# Patient Record
Sex: Female | Born: 1970
Health system: Southern US, Community
[De-identification: ages and names within clinical notes are randomized; demographics above are authoritative.]

## PROBLEM LIST (undated history)

## (undated) DIAGNOSIS — H9190 Unspecified hearing loss, unspecified ear: Secondary | ICD-10-CM

## (undated) DIAGNOSIS — Z9889 Other specified postprocedural states: Secondary | ICD-10-CM

## (undated) DIAGNOSIS — Z923 Personal history of irradiation: Secondary | ICD-10-CM

## (undated) DIAGNOSIS — C50411 Malignant neoplasm of upper-outer quadrant of right female breast: Principal | ICD-10-CM

## (undated) DIAGNOSIS — C50919 Malignant neoplasm of unspecified site of unspecified female breast: Secondary | ICD-10-CM

## (undated) DIAGNOSIS — R0683 Snoring: Secondary | ICD-10-CM

## (undated) DIAGNOSIS — K219 Gastro-esophageal reflux disease without esophagitis: Secondary | ICD-10-CM

## (undated) HISTORY — PX: CHOLECYSTECTOMY: SHX55

## (undated) HISTORY — DX: Malignant neoplasm of unspecified site of unspecified female breast: C50.919

## (undated) HISTORY — DX: Other specified postprocedural states: Z98.890

## (undated) HISTORY — PX: WISDOM TOOTH EXTRACTION: SHX21

## (undated) HISTORY — PX: ESSURE TUBAL LIGATION: SUR464

## (undated) HISTORY — DX: Malignant neoplasm of upper-outer quadrant of right female breast: C50.411

## (undated) HISTORY — DX: Unspecified hearing loss, unspecified ear: H91.90

---

## 2000-06-19 ENCOUNTER — Other Ambulatory Visit: Admission: RE | Admit: 2000-06-19 | Discharge: 2000-06-19 | Payer: Self-pay | Admitting: Obstetrics and Gynecology

## 2001-06-25 ENCOUNTER — Other Ambulatory Visit: Admission: RE | Admit: 2001-06-25 | Discharge: 2001-06-25 | Payer: Self-pay | Admitting: Obstetrics and Gynecology

## 2002-06-24 ENCOUNTER — Other Ambulatory Visit: Admission: RE | Admit: 2002-06-24 | Discharge: 2002-06-24 | Payer: Self-pay | Admitting: Obstetrics and Gynecology

## 2003-07-03 ENCOUNTER — Other Ambulatory Visit: Admission: RE | Admit: 2003-07-03 | Discharge: 2003-07-03 | Payer: Self-pay | Admitting: Obstetrics and Gynecology

## 2004-01-04 ENCOUNTER — Observation Stay (HOSPITAL_COMMUNITY): Admission: RE | Admit: 2004-01-04 | Discharge: 2004-01-05 | Payer: Self-pay | Admitting: General Surgery

## 2004-01-04 ENCOUNTER — Encounter (INDEPENDENT_AMBULATORY_CARE_PROVIDER_SITE_OTHER): Payer: Self-pay | Admitting: Specialist

## 2004-07-12 ENCOUNTER — Other Ambulatory Visit: Admission: RE | Admit: 2004-07-12 | Discharge: 2004-07-12 | Payer: Self-pay | Admitting: Obstetrics and Gynecology

## 2006-02-13 ENCOUNTER — Other Ambulatory Visit: Admission: RE | Admit: 2006-02-13 | Discharge: 2006-02-13 | Payer: Self-pay | Admitting: Obstetrics and Gynecology

## 2006-08-13 ENCOUNTER — Inpatient Hospital Stay (HOSPITAL_COMMUNITY): Admission: AD | Admit: 2006-08-13 | Discharge: 2006-08-13 | Payer: Self-pay | Admitting: Obstetrics and Gynecology

## 2006-08-17 ENCOUNTER — Inpatient Hospital Stay (HOSPITAL_COMMUNITY): Admission: AD | Admit: 2006-08-17 | Discharge: 2006-08-19 | Payer: Self-pay | Admitting: Obstetrics and Gynecology

## 2007-01-22 ENCOUNTER — Ambulatory Visit (HOSPITAL_COMMUNITY): Admission: RE | Admit: 2007-01-22 | Discharge: 2007-01-22 | Payer: Self-pay | Admitting: Obstetrics and Gynecology

## 2007-04-30 ENCOUNTER — Encounter: Admission: RE | Admit: 2007-04-30 | Discharge: 2007-04-30 | Payer: Self-pay | Admitting: Obstetrics and Gynecology

## 2008-05-01 ENCOUNTER — Encounter: Admission: RE | Admit: 2008-05-01 | Discharge: 2008-05-01 | Payer: Self-pay | Admitting: Obstetrics and Gynecology

## 2009-05-04 ENCOUNTER — Encounter: Admission: RE | Admit: 2009-05-04 | Discharge: 2009-05-04 | Payer: Self-pay | Admitting: Obstetrics and Gynecology

## 2010-05-31 ENCOUNTER — Encounter: Admission: RE | Admit: 2010-05-31 | Discharge: 2010-05-31 | Payer: Self-pay | Admitting: Obstetrics and Gynecology

## 2010-12-20 NOTE — Discharge Summary (Signed)
Dorothy Gallagher, Dorothy Gallagher                ACCOUNT NO.:  [id number]   MEDICAL RECORD NO.:  [id number]          PATIENT TYPE:  INP   LOCATION:  9110                          FACILITY:  [facility]   PHYSICIAN:  Zenaida Niece, M.D.DATE OF BIRTH:  06/24/1971   DATE OF ADMISSION:  08/17/2006  DATE OF DISCHARGE:  08/19/2006                               DISCHARGE SUMMARY   ADMISSION DIAGNOSES:  1. Intrauterine pregnancy at 38 weeks.  2. Pregnancy-induced hypertension.   DISCHARGE DIAGNOSES:  1. Intrauterine pregnancy at 38 weeks.  2. Pregnancy-induced hypertension.   PROCEDURE:  On August 17, 2006, she had a spontaneous vaginal delivery.   HISTORY AND PHYSICAL:  This is a 40 year old white female gravida 3,  para 2-0-0-2 with an EGA of [redacted] weeks who presents with a complaint of  ruptured membranes at 0415 on the day of admission with occasional  contractions.  Evaluation in triage revealed rupture of membranes and  cervix was 2-3 cm dilated.  Prenatal care complicated by advanced  maternal age with normal ultrasound.  She has had gastroesophageal  reflux treated with Protonix.  She has had intermittently elevated blood  pressures with normal labs.   PRENATAL LABS:  Blood type is A+ with a negative antibody screen, RPR  nonreactive, rubella immune, hepatitis B surface antigen negative,  gonorrhea and chlamydia negative, 1-hour Glucola 160, 3-hour GTT is 72,  160, 143 and 135 and group B strep is negative.   PAST OBSTETRICAL HISTORY:  Two vaginal deliveries at term, 8 pounds 3  ounces and 8 pounds with PIH with the second pregnancy.   GYNECOLOGICAL HISTORY:  Remote history of cryotherapy.   PAST SURGICAL HISTORY:  Cholecystectomy.   ALLERGIES:  None.   MEDICATIONS:  Protonix.   PHYSICAL EXAMINATION:  VITAL SIGNS:  She is afebrile with stable vital  signs except for blood pressures 150s/100s.  Fetal heart tracing is  reactive with contractions every 3-4 minutes on Pitocin.  ABDOMEN:   Gravid, nontender with an estimated fetal weight of 8 pounds.  CERVIX:  On admission is 2-3 cm dilated, on labor and delivery, the  nurse says she is 3, 70, -2 and vertex.   HOSPITAL COURSE:  The patient was admitted and put on Pitocin for labor  augmentation.  PIH labs were drawn and were normal.  Blood pressure  elevated to the 150s-160s/100s-110s and she was treated twice with 20 mg  of IV labetalol.  She progressed in labor and received an epidural.  She  progressed to complete, pushed well and on the evening of August 17, 2006, had a vaginal delivery of a viable female infant with Apgars of 7  and 9 that weighed 7 pounds 1 ounce and a loose nuchal cord x1 was  reduced.  Placenta delivered spontaneous, was intact and sent for cord  blood collection.  Perineum was intact and estimated blood loss was less  than 500 mL.   Postpartum, she had intermittently elevated blood pressures with repeat  PIH labs normal.  Predelivery hemoglobin 11.3, postdelivery 11.1.   On postpartum #2, she was felt to be  stable enough for discharge home.   DISCHARGE INSTRUCTIONS:  Regular diet, pelvic rest, follow-up is in 1  week to check her blood pressure.   MEDICATIONS:  Over-the-counter ibuprofen as needed.   She is given our discharge pamphlet.      Zenaida Niece, M.D.  Electronically Signed     TDM/MEDQ  D:  08/19/2006  T:  08/19/2006  Job:  [id number]

## 2010-12-20 NOTE — Op Note (Signed)
NAME:  Dorothy Gallagher, Dorothy Gallagher                          ACCOUNT NO.:  [id number]   MEDICAL RECORD NO.:  [id number]                   PATIENT TYPE:  AMB   LOCATION:  DAY                                  FACILITY:  [facility]   PHYSICIAN:  Ollen Gross. Vernell Morgans, M.D.              DATE OF BIRTH:  05-Oct-1970   DATE OF PROCEDURE:  01/04/2004  DATE OF DISCHARGE:                                 OPERATIVE REPORT   PREOPERATIVE DIAGNOSIS:  Gallstones.   POSTOPERATIVE DIAGNOSIS:  Gallstones.   OPERATION/PROCEDURE:  Laparoscopic cholecystectomy with intraoperative  cholangiogram.   SURGEON:  Ollen Gross. Carolynne Edouard, M.D.   ASSISTANT:  Abigail Miyamoto, M.D.   ANESTHESIA:  General endotracheal anesthesia.   DESCRIPTION OF PROCEDURE:  After informed consent was obtained, the patient  was brought to the operating room and placed in the supine position on the  operating room table.  After adequate induction of general anesthesia, the  patient's abdomen was prepped with Betadine and draped in the usual sterile  manner.  The area below the umbilicus was infiltrated with 0.25% Marcaine  with epinephrine.  A small incision was made with the 15-blade knife.  This  incision was carried down through the subcutaneous tissue bluntly with the  Kelly clamp and [employer] retractors until the linea alba was identified.  The linea alba was incised with the 15-blade knife and each side was grasped  with Kocher clamps and elevated anteriorly.  The preperitoneal space was  then probed bluntly with a hemostat until the peritoneum was opened and  access was gained to the abdominal cavity.  A 0 Vicryl pursestring stitch  was placed in the fascia surrounding the opening. The Hasson cannula was  placed through the opening and anchored in place with the previously placed  Vicryl pursestring stitch.  The abdomen was then insufflated with carbon  dioxide without difficulty.  The patient was placed in the head-up position  and rotated slightly  with the right side up.  The laparoscope was placed  through the Hasson cannula and the right upper quadrant was inspected.  The  dome of the gallbladder and liver were readily identified.  The epigastric  region was then infiltrated with 0.25% Marcaine with epinephrine.  A small  incision was made with the 15-blade knife and a 10 mm port was placed  bluntly through this incision into the abdominal cavity under direct vision.  Sites were then chosen laterally on the right side of the abdomen with  placement of a 5 mm ports.  Each of these areas was infiltrated with 0.25%  Marcaine.  Small stab incisions were made with 15-blade knife and 5 mm ports  were placed bluntly through these incisions into the abdominal cavity under  direct vision.  A blunt grasper was placed through the lateral-most 5 mm  port and used to grasp the dome of the gallbladder and elevate it anteriorly  and  superiorly.  Another blunt grasper was placed in the other 5 mm port and  used to retract on the body and neck of the gallbladder.  Some adhesions to  the gallbladder body were taken down sharply with the laparoscopic scissors.  Next, a laparoscopic dissector was placed through the epigastric port and  using the electrocautery, the peritoneal reflection at the gallbladder neck  area was opened.  Blunt dissection was then carried out in this area until  the gallbladder neck-cystic duct junction was readily identified and a good  window was created.  A single clip was placed proximally on the gallbladder  neck.  A small ductotomy was made just below the clip.  A 14-gauge angiocath  was placed percutaneously through the anterior abdominal wall under direct  vision.  A Reddick cholangiogram catheter was placed through the angiocath  and flushed.  The Reddick catheter was then placed within the cystic duct  and anchored in place with a clip.  A cholangiogram was obtained that showed  no filling defects, good emptying into  the duodenum and along the length on  the cystic duct.  The anchoring clip and catheters were then removed from  the patient.  Two clips were placed proximally on the cystic duct and the  duct was divided between the two sets of clips.  Posterior to this the  cystic artery was identified and dissected bluntly in a circumferential  manner until a good window was created.  Two clips were placed proximally  and one distally on the artery and the artery was divided between the two.  Next, the laparoscopic hook cautery device was used to separate the  gallbladder from the liver bed.  Prior to completely detaching the  gallbladder from the liver bed, the liver bed was inspected and several  small bleeding points were coagulated with the electrocautery.  The  gallbladder was then attached the rest of the way from the liver bed without  difficulty with the hook cautery.  A laparoscopic bag was then placed  through the epigastric port and opened.  The gallbladder was placed within  the bag and the bag was sealed.  The abdomen was then irrigated with copious  amounts of saline until the effluent was clear.  The liver bed was inspected  and again was found to be hemostatic.  The  laparoscopic was then moved to  the epigastric port and a gallbladder grasper was placed through the Hasson  cannula and used to grasp the open end of the bag.  The bag with the  gallbladder was then removed with the Hasson cannula through the  infraumbilical port without difficulty.  The fascial defect was closed with  the previously placed Vicryl pursestring stitch as well as with another  figure-of-eight 0 Vicryl stitch.  The rest of the ports were removed under  direct vision and were found to be hemostatic.  The gas was allowed to  escape.  The skin incisions were all closed with interrupted 4-0 Monocryl  subcuticular stitches.  Benzoin and Steri-Strips were applied.  The patient tolerated the procedure well.  At the end  of the case all needle, sponge and  instrument counts were correct.  The patient was awakened and taken to the  recovery room in stable condition.  Ollen Gross. Vernell Morgans, M.D.    PST/MEDQ  D:  01/04/2004  T:  01/04/2004  Job:  [id number]

## 2011-10-29 ENCOUNTER — Other Ambulatory Visit: Payer: Self-pay | Admitting: Otolaryngology

## 2011-10-29 DIAGNOSIS — H9121 Sudden idiopathic hearing loss, right ear: Secondary | ICD-10-CM

## 2011-10-31 ENCOUNTER — Ambulatory Visit
Admission: RE | Admit: 2011-10-31 | Discharge: 2011-10-31 | Disposition: A | Discharge status: Home / Self Care | Payer: Managed Care, Other (non HMO) | Source: Ambulatory Visit | Attending: Otolaryngology | Admitting: Otolaryngology

## 2011-10-31 DIAGNOSIS — H9121 Sudden idiopathic hearing loss, right ear: Secondary | ICD-10-CM

## 2011-10-31 MED ORDER — GADOBENATE DIMEGLUMINE 529 MG/ML IV SOLN
19.0000 mL | Freq: Once | INTRAVENOUS | Status: AC | PRN
  Administered 2011-10-31: 19 mL via INTRAVENOUS

## 2011-12-17 ENCOUNTER — Other Ambulatory Visit: Payer: Self-pay | Admitting: Neurology

## 2011-12-17 DIAGNOSIS — R42 Dizziness and giddiness: Secondary | ICD-10-CM

## 2011-12-17 DIAGNOSIS — H9191 Unspecified hearing loss, right ear: Secondary | ICD-10-CM

## 2011-12-27 ENCOUNTER — Ambulatory Visit
Admission: RE | Admit: 2011-12-27 | Discharge: 2011-12-27 | Disposition: A | Discharge status: Home / Self Care | Payer: Managed Care, Other (non HMO) | Source: Ambulatory Visit | Attending: Neurology | Admitting: Neurology

## 2011-12-27 DIAGNOSIS — H9191 Unspecified hearing loss, right ear: Secondary | ICD-10-CM

## 2011-12-27 DIAGNOSIS — R42 Dizziness and giddiness: Secondary | ICD-10-CM

## 2011-12-27 MED ORDER — GADOBENATE DIMEGLUMINE 529 MG/ML IV SOLN
19.0000 mL | Freq: Once | INTRAVENOUS | Status: AC | PRN
  Administered 2011-12-27: 19 mL via INTRAVENOUS

## 2012-04-30 ENCOUNTER — Other Ambulatory Visit: Payer: Self-pay | Admitting: Obstetrics and Gynecology

## 2012-04-30 DIAGNOSIS — Z1231 Encounter for screening mammogram for malignant neoplasm of breast: Secondary | ICD-10-CM

## 2012-05-31 ENCOUNTER — Ambulatory Visit: Payer: Managed Care, Other (non HMO)

## 2012-06-07 ENCOUNTER — Ambulatory Visit
Admission: RE | Admit: 2012-06-07 | Discharge: 2012-06-07 | Disposition: A | Discharge status: Home / Self Care | Payer: Managed Care, Other (non HMO) | Source: Ambulatory Visit | Attending: Obstetrics and Gynecology | Admitting: Obstetrics and Gynecology

## 2012-06-07 DIAGNOSIS — Z1231 Encounter for screening mammogram for malignant neoplasm of breast: Secondary | ICD-10-CM

## 2012-06-10 ENCOUNTER — Other Ambulatory Visit: Payer: Self-pay | Admitting: Obstetrics and Gynecology

## 2012-06-10 DIAGNOSIS — R928 Other abnormal and inconclusive findings on diagnostic imaging of breast: Secondary | ICD-10-CM

## 2012-06-11 ENCOUNTER — Ambulatory Visit
Admission: RE | Admit: 2012-06-11 | Discharge: 2012-06-11 | Disposition: A | Discharge status: Home / Self Care | Payer: Managed Care, Other (non HMO) | Source: Ambulatory Visit | Attending: Obstetrics and Gynecology | Admitting: Obstetrics and Gynecology

## 2012-06-11 DIAGNOSIS — R928 Other abnormal and inconclusive findings on diagnostic imaging of breast: Secondary | ICD-10-CM

## 2013-05-16 ENCOUNTER — Other Ambulatory Visit: Payer: Self-pay

## 2013-05-16 DIAGNOSIS — Z1231 Encounter for screening mammogram for malignant neoplasm of breast: Secondary | ICD-10-CM

## 2013-06-17 ENCOUNTER — Ambulatory Visit
Admission: RE | Admit: 2013-06-17 | Discharge: 2013-06-17 | Disposition: A | Discharge status: Home / Self Care | Payer: Managed Care, Other (non HMO) | Source: Ambulatory Visit

## 2013-06-17 DIAGNOSIS — Z1231 Encounter for screening mammogram for malignant neoplasm of breast: Secondary | ICD-10-CM

## 2014-05-26 ENCOUNTER — Other Ambulatory Visit: Payer: Self-pay

## 2014-05-26 DIAGNOSIS — Z1231 Encounter for screening mammogram for malignant neoplasm of breast: Secondary | ICD-10-CM

## 2014-06-19 ENCOUNTER — Ambulatory Visit: Payer: Managed Care, Other (non HMO)

## 2014-06-19 ENCOUNTER — Ambulatory Visit
Admission: RE | Admit: 2014-06-19 | Discharge: 2014-06-19 | Disposition: A | Discharge status: Home / Self Care | Payer: Managed Care, Other (non HMO) | Source: Ambulatory Visit

## 2014-06-19 DIAGNOSIS — Z1231 Encounter for screening mammogram for malignant neoplasm of breast: Secondary | ICD-10-CM

## 2014-08-04 DIAGNOSIS — Z9889 Other specified postprocedural states: Secondary | ICD-10-CM

## 2014-08-04 HISTORY — DX: Other specified postprocedural states: Z98.890

## 2015-09-27 ENCOUNTER — Other Ambulatory Visit: Payer: Self-pay | Admitting: Obstetrics and Gynecology

## 2015-09-27 DIAGNOSIS — R928 Other abnormal and inconclusive findings on diagnostic imaging of breast: Secondary | ICD-10-CM

## 2015-10-02 ENCOUNTER — Other Ambulatory Visit: Payer: Self-pay | Admitting: Obstetrics and Gynecology

## 2015-10-02 ENCOUNTER — Ambulatory Visit
Admission: RE | Admit: 2015-10-02 | Discharge: 2015-10-02 | Disposition: A | Discharge status: Home / Self Care | Payer: Managed Care, Other (non HMO) | Source: Ambulatory Visit | Attending: Obstetrics and Gynecology | Admitting: Obstetrics and Gynecology

## 2015-10-02 DIAGNOSIS — R928 Other abnormal and inconclusive findings on diagnostic imaging of breast: Secondary | ICD-10-CM

## 2015-10-04 ENCOUNTER — Ambulatory Visit
Admission: RE | Admit: 2015-10-04 | Discharge: 2015-10-04 | Disposition: A | Discharge status: Home / Self Care | Payer: Managed Care, Other (non HMO) | Source: Ambulatory Visit | Attending: Obstetrics and Gynecology | Admitting: Obstetrics and Gynecology

## 2015-10-04 ENCOUNTER — Other Ambulatory Visit: Payer: Self-pay | Admitting: Obstetrics and Gynecology

## 2015-10-04 DIAGNOSIS — R928 Other abnormal and inconclusive findings on diagnostic imaging of breast: Secondary | ICD-10-CM

## 2015-10-04 DIAGNOSIS — C50411 Malignant neoplasm of upper-outer quadrant of right female breast: Secondary | ICD-10-CM

## 2015-10-04 HISTORY — PX: BREAST BIOPSY: SHX20

## 2015-10-04 HISTORY — DX: Malignant neoplasm of upper-outer quadrant of right female breast: C50.411

## 2015-10-05 ENCOUNTER — Encounter: Payer: Self-pay | Admitting: *Deleted

## 2015-10-05 ENCOUNTER — Telehealth: Payer: Self-pay | Admitting: *Deleted

## 2015-10-05 DIAGNOSIS — C50411 Malignant neoplasm of upper-outer quadrant of right female breast: Secondary | ICD-10-CM

## 2015-10-05 NOTE — Telephone Encounter (Signed)
Confirmed [facility] for 10/10/15 at 1215.  Instructions and contact information given.

## 2015-10-08 ENCOUNTER — Telehealth: Payer: Self-pay | Admitting: *Deleted

## 2015-10-08 NOTE — Telephone Encounter (Signed)
Mailed clinic packet to pt.  

## 2015-10-09 ENCOUNTER — Inpatient Hospital Stay: Admission: RE | Admit: 2015-10-09 | Payer: Managed Care, Other (non HMO) | Source: Ambulatory Visit

## 2015-10-10 ENCOUNTER — Other Ambulatory Visit: Payer: Self-pay | Admitting: General Surgery

## 2015-10-10 ENCOUNTER — Ambulatory Visit: Payer: Managed Care, Other (non HMO) | Attending: General Surgery | Admitting: Physical Therapy

## 2015-10-10 ENCOUNTER — Encounter: Payer: Self-pay | Admitting: Nurse Practitioner

## 2015-10-10 ENCOUNTER — Encounter: Payer: Self-pay | Admitting: Physical Therapy

## 2015-10-10 ENCOUNTER — Other Ambulatory Visit (HOSPITAL_BASED_OUTPATIENT_CLINIC_OR_DEPARTMENT_OTHER): Payer: Managed Care, Other (non HMO)

## 2015-10-10 ENCOUNTER — Ambulatory Visit (HOSPITAL_BASED_OUTPATIENT_CLINIC_OR_DEPARTMENT_OTHER): Payer: Managed Care, Other (non HMO) | Admitting: Hematology and Oncology

## 2015-10-10 ENCOUNTER — Encounter: Payer: Self-pay | Admitting: Genetic Counselor

## 2015-10-10 ENCOUNTER — Ambulatory Visit
Admission: RE | Admit: 2015-10-10 | Discharge: 2015-10-10 | Disposition: A | Discharge status: Home / Self Care | Payer: Managed Care, Other (non HMO) | Source: Ambulatory Visit | Attending: Radiation Oncology | Admitting: Radiation Oncology

## 2015-10-10 VITALS — BP 143/77 | HR 70 | Temp 98.0°F | Resp 18 | Ht 64.0 in | Wt 201.8 lb

## 2015-10-10 DIAGNOSIS — C50411 Malignant neoplasm of upper-outer quadrant of right female breast: Secondary | ICD-10-CM

## 2015-10-10 DIAGNOSIS — R293 Abnormal posture: Secondary | ICD-10-CM | POA: Diagnosis present

## 2015-10-10 DIAGNOSIS — Z170 Estrogen receptor positive status [ER+]: Secondary | ICD-10-CM

## 2015-10-10 LAB — CBC WITH DIFFERENTIAL/PLATELET
BASO%: 0.5 % (ref 0.0–2.0)
Basophils Absolute: 0 10*3/uL (ref 0.0–0.1)
EOS%: 2.7 % (ref 0.0–7.0)
Eosinophils Absolute: 0.2 10*3/uL (ref 0.0–0.5)
HCT: 37.2 % (ref 34.8–46.6)
HEMOGLOBIN: 12.6 g/dL (ref 11.6–15.9)
LYMPH%: 30.5 % (ref 14.0–49.7)
MCH: 29 pg (ref 25.1–34.0)
MCHC: 33.9 g/dL (ref 31.5–36.0)
MCV: 85.7 fL (ref 79.5–101.0)
MONO#: 0.6 10*3/uL (ref 0.1–0.9)
MONO%: 8.6 % (ref 0.0–14.0)
NEUT%: 57.7 % (ref 38.4–76.8)
NEUTROS ABS: 4.2 10*3/uL (ref 1.5–6.5)
Platelets: 304 10*3/uL (ref 145–400)
RBC: 4.34 10*6/uL (ref 3.70–5.45)
RDW: 13.1 % (ref 11.2–14.5)
WBC: 7.3 10*3/uL (ref 3.9–10.3)
lymph#: 2.2 10*3/uL (ref 0.9–3.3)

## 2015-10-10 LAB — COMPREHENSIVE METABOLIC PANEL
ALBUMIN: 3.9 g/dL (ref 3.5–5.0)
ALK PHOS: 68 U/L (ref 40–150)
ALT: 13 U/L (ref 0–55)
AST: 14 U/L (ref 5–34)
Anion Gap: 10 mEq/L (ref 3–11)
BUN: 12.4 mg/dL (ref 7.0–26.0)
CO2: 25 meq/L (ref 22–29)
Calcium: 9.6 mg/dL (ref 8.4–10.4)
Chloride: 106 mEq/L (ref 98–109)
Creatinine: 0.8 mg/dL (ref 0.6–1.1)
EGFR: 87 mL/min/{1.73_m2} — ABNORMAL LOW (ref >90)
GLUCOSE: 97 mg/dL (ref 70–140)
POTASSIUM: 4.2 meq/L (ref 3.5–5.1)
SODIUM: 141 meq/L (ref 136–145)
TOTAL PROTEIN: 7.4 g/dL (ref 6.4–8.3)
Total Bilirubin: <0.3 mg/dL (ref 0.20–1.20)

## 2015-10-10 NOTE — Progress Notes (Signed)
Radiation Oncology         [phone] ________________________________  Name: Dorothy Gallagher MRN: [id number]  Date: 10/10/2015  DOB: 26-Apr-1971  RA:[id number] D, MD  Rolm Bookbinder, MD     REFERRING PHYSICIAN: Rolm Bookbinder, MD   DIAGNOSIS: The encounter diagnosis was Breast cancer of upper-outer quadrant of right female breast [facility]).   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   HISTORY OF PRESENT ILLNESS::Eleny C Koffman is a 45 y.o. female who is seen for an initial consultation visit. She had a mammogram of the right breast that showed calcifications which led to diagnostic imaging. She had an ultrasound of the breast 10/02/2015, revealing a hypoechoic mass in the 10 o'clock position, 3 cm from the nipple, with normal-appearing lymph nodes. She had an ultrasound guided biopsy 10/04/2015, revealing grade II invasive and in situ ductal carcinoma of the right breast, ER (90%), PR (90%), Ki 67 (10%), and Her2-neu negative. She is accompanied by her sister and husband for today's visit to the [facility] breast clinic and specifically is seen by Dr. Lisbeth Renshaw to discuss the role of radiotherapy for her cancer.  PREVIOUS RADIATION THERAPY: No   PAST MEDICAL HISTORY:  Past Medical History  Diagnosis Date  . Breast cancer of upper-outer quadrant of right female breast ([place]) 10/05/2015  . Breast cancer ([place])   . Hearing loss     right ear      PAST SURGICAL HISTORY: Past Surgical History  Procedure Laterality Date  . Cholecystectomy      FAMILY HISTORY:  Family History  Problem Relation Age of Onset  . Breast cancer Mother 28  . Lung cancer Father   . Colon cancer Maternal Grandmother      SOCIAL HISTORY:  reports that she has never smoked. She does not have any smokeless tobacco history on file. She reports that she drinks alcohol. She reports that she does not use illicit drugs. 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 She has three children and is married.   ALLERGIES: Review of patient's allergies indicates no known allergies.   MEDICATIONS:  Current Outpatient Prescriptions  Medication Sig Dispense Refill  . ranitidine (ZANTAC) 150 MG tablet Take 150 mg by mouth 2 (two) times daily.     No current facility-administered medications for this encounter.     REVIEW OF SYSTEMS: On review of systems, the patient reports that she is doing extremely well despite the anxiety of her new diagnosis. She denies any chest pain, shortness of breath, fevers, chills, unintended weight changes. She has not had any changes in appetite, abdominal pain, nausea, vomiting, dysfunction of her bowel or bladder activity. A complete review of systems is obtained and is otherwise negative.    PHYSICAL EXAM:  Wt 201 lbs BP 143/77 P 70 RR 18 T 98  Pulse ox 99% RA Pain scale 0/10 In general this is a well appearing Caucasian female in no acute distress. She is alert and oriented x4 and appropriate throughout the examination. Skin is intact. Cardiovascular exam reveals a regular rate and rhythm, no clicks rubs or murmurs are auscultated. Chest is clear to auscultation bilaterally. Lymphatic assessment is performed and does not reveal any adenopathy in the cervical, supraclavicular, axillary, or inguinal chains. Bilateral breasts are examined, the right breast reveals post biopsy findings with minimal ecchymosis. The right breast otherwise does not have any palpable abnormalities. No nipple discharge or bleeding  is present. The left breast is also examined and no palpable abnormalities are present. No nipple bleeding or discharge is noted. Abdomen has active bowel sounds in all quadrants and is intact. The abdomen is soft, non tender, non distended. Lower extremities are negative for pretibial pitting edema, deep calf tenderness, cyanosis or clubbing.   ECOG = 100  0 - Asymptomatic (Fully active, able to  carry on all predisease activities without restriction)  1 - Symptomatic but completely ambulatory (Restricted in physically strenuous activity but ambulatory and able to carry out work of a light or sedentary nature. For example, light housework, office work)  2 - Symptomatic, <50% in bed during the day (Ambulatory and capable of all self care but unable to carry out any work activities. Up and about more than 50% of waking hours)  3 - Symptomatic, >50% in bed, but not bedbound (Capable of only limited self-care, confined to bed or chair 50% or more of waking hours)  4 - Bedbound (Completely disabled. Cannot carry on any self-care. Totally confined to bed or chair)  5 - Death   Eustace Pen MM, Creech RH, Tormey DC, et al. [phone]). "Toxicity and response criteria of the [facility] Group". [place] Oncol. 5 (6): 649-55   LABORATORY DATA:  Lab Results  Component Value Date   WBC 7.3 10/10/2015   HGB 12.6 10/10/2015   HCT 37.2 10/10/2015   MCV 85.7 10/10/2015   PLT 304 10/10/2015   Lab Results  Component Value Date   NA 141 10/10/2015   K 4.2 10/10/2015   CO2 25 10/10/2015   Lab Results  Component Value Date   ALT 13 10/10/2015   AST 14 10/10/2015   ALKPHOS 68 10/10/2015   BILITOT <0.30 10/10/2015      RADIOGRAPHY: Mm Digital Diagnostic Unilat R  10/04/2015  CLINICAL DATA:  Post biopsy mammogram of the right breast for clip placement. EXAM: DIAGNOSTIC RIGHT MAMMOGRAM POST ULTRASOUND BIOPSY COMPARISON:  Previous exam(s). FINDINGS: Mammographic images were obtained following ultrasound guided biopsy of mass in the right breast at 10 o'clock. The ribbon shaped biopsy marking clip is appropriately positioned within the biopsied mass at 10 o'clock in the right breast. IMPRESSION: The ribbon shaped biopsy marking clip is appropriately positioned within the biopsied mass at 10 o'clock in the right breast. Final Assessment: Post Procedure Mammograms for Marker Placement  Electronically Signed   By: Ammie Ferrier M.D.   On: 10/04/2015 13:36   US Breast Ltd Uni Right Inc Axilla  10/02/2015  CLINICAL DATA:  Screening recall for a possible right breast mass. EXAM: DIGITAL DIAGNOSTIC RIGHT MAMMOGRAM WITH 3D TOMOSYNTHESIS AND CAD RIGHT BREAST ULTRASOUND COMPARISON:  Previous exam(s). ACR Breast Density Category c: The breast tissue is heterogeneously dense, which may obscure small masses. FINDINGS: On the lateral right breast, seen best on the CC view there is an area of distortion with a possible mass within the middle depth. This is not clearly seen on the MLO or true lateral views. No other suspicious calcifications, masses or areas of distortion are seen in the right breast. Mammographic images were processed with CAD. Physical exam of the lateral right breast demonstrates a a subtle firm palpable mass in the upper-outer quadrant. Ultrasound targeted to the right breast at 10 o'clock, 3 cm from the nipple demonstrates a hypoechoic spiculated mass with mild posterior acoustic shadowing measuring 1.4 x 1.2 x 0.9 cm. Ultrasound of the right axilla demonstrates multiple normal-appearing lymph nodes. IMPRESSION: 1. The  mass in the right breast at 10 o'clock is highly suspicious for malignancy. 2.  No evidence of right axillary lymphadenopathy. RECOMMENDATION: Ultrasound-guided biopsy is recommended for the left breast mass, and is currently scheduled for 10/09/2015 at 1 p.m., although we are attempting to get in touch with the patient to move up the appointment time to 10/04/2015 atat 12 noon. If we are unable to get in contact with the patient, the appointment for 10/09/2015 will be held. I have discussed the findings and recommendations with the patient. Results were also provided in writing at the conclusion of the visit. If applicable, a reminder letter will be sent to the patient regarding the next appointment. BI-RADS CATEGORY  5: Highly suggestive of malignancy.  Electronically Signed   By: Ammie Ferrier M.D.   On: 10/02/2015 16:29   Mm Diag Breast Tomo Uni Right  10/02/2015  CLINICAL DATA:  Screening recall for a possible right breast mass. EXAM: DIGITAL DIAGNOSTIC RIGHT MAMMOGRAM WITH 3D TOMOSYNTHESIS AND CAD RIGHT BREAST ULTRASOUND COMPARISON:  Previous exam(s). ACR Breast Density Category c: The breast tissue is heterogeneously dense, which may obscure small masses. FINDINGS: On the lateral right breast, seen best on the CC view there is an area of distortion with a possible mass within the middle depth. This is not clearly seen on the MLO or true lateral views. No other suspicious calcifications, masses or areas of distortion are seen in the right breast. Mammographic images were processed with CAD. Physical exam of the lateral right breast demonstrates a a subtle firm palpable mass in the upper-outer quadrant. Ultrasound targeted to the right breast at 10 o'clock, 3 cm from the nipple demonstrates a hypoechoic spiculated mass with mild posterior acoustic shadowing measuring 1.4 x 1.2 x 0.9 cm. Ultrasound of the right axilla demonstrates multiple normal-appearing lymph nodes. IMPRESSION: 1. The mass in the right breast at 10 o'clock is highly suspicious for malignancy. 2.  No evidence of right axillary lymphadenopathy. RECOMMENDATION: Ultrasound-guided biopsy is recommended for the left breast mass, and is currently scheduled for 10/09/2015 at 1 p.m., although we are attempting to get in touch with the patient to move up the appointment time to 10/04/2015 atat 12 noon. If we are unable to get in contact with the patient, the appointment for 10/09/2015 will be held. I have discussed the findings and recommendations with the patient. Results were also provided in writing at the conclusion of the visit. If applicable, a reminder letter will be sent to the patient regarding the next appointment. BI-RADS CATEGORY  5: Highly suggestive of malignancy. Electronically  Signed   By: Ammie Ferrier M.D.   On: 10/02/2015 16:29   [name] Rt Breast Bx W Loc Dev 1st Lesion Img Bx Spec US Guide  10/08/2015  ADDENDUM REPORT: 10/08/2015 11:11 ADDENDUM: Pathology revealed GRADE II INVASIVE AND IN SITU DUCTAL CARCINOMA of the Right breast at the 10:00 o'clock location. This was found to be concordant by Dr. Ammie Ferrier. Pathology results were discussed with the patient by telephone. The patient reported doing well after the biopsy. Post biopsy instructions and care were reviewed and questions were answered. The patient was encouraged to call [facility] for any additional concerns. The patient was referred to [facility] on October 10, 2015. Pathology results reported by Terie Purser, RN on 10/05/2015. Electronically Signed   By: Ammie Ferrier M.D.   On: 10/08/2015 11:11  10/08/2015  CLINICAL DATA:  45 year old female presenting for ultrasound-guided biopsy of a right breast mass. EXAM: ULTRASOUND GUIDED RIGHT BREAST CORE NEEDLE BIOPSY COMPARISON:  Previous exam(s). FINDINGS: I met with the patient and we discussed the procedure of ultrasound-guided biopsy, including benefits and alternatives. We discussed the high likelihood of a successful procedure. We discussed the risks of the procedure, including infection, bleeding, tissue injury, clip migration, and inadequate sampling. Informed written consent was given. The usual time-out protocol was performed immediately prior to the procedure. Using sterile technique and 1% Lidocaine as local anesthetic, under direct ultrasound visualization, a 12 gauge spring-loaded device was used to perform biopsy of a mass in the right breast at 10 o'clock using a inferior approach. At the conclusion of the procedure a ribbon shaped tissue marker clip was deployed into the biopsy cavity. Follow up 2 view mammogram was performed and dictated separately.  IMPRESSION: Ultrasound guided biopsy of right breast mass at 10 o'clock. No apparent complications. Electronically Signed: By: Ammie Ferrier M.D. On: 10/04/2015 13:35    IMPRESSION: Ms. Blizzard is a 44 yo female with grade II invasive and in situ ductal, ER/PR positive carcinoma of the right breast.   PLAN: Dr. Lisbeth Renshaw discusses the rationale for radiotherapy if it pertains to her care plan. He discusses again the consensus was for her to undergo lumpectomy of the right breast with sentinel lymph node sampling with Oncotype testing of her tumor, as well as genetic testing, external radiation,and use of aromatase inhibitor. The patient is counseled on the rationale for 33 fractions of treatment to the right breast following surgery and provided that no additional adjuvant therapy to be required based on her Oncotype testing. We discussed the risks, benefits, short and long-term effects of radiotherapy, and the patient is interested in moving forward when clinically appropriate. We will plan to see her back following surgery when she is ready to move forward with radiotherapy.   The above documentation reflects my direct findings during this shared patient visit. Please see the separate note by Dr. Lisbeth Renshaw on this date for the remainder of the patient's plan of care.  Carola Rhine, PAC    This document serves as a record of services personally performed by Shona Simpson, PAC and Kyung Rudd, MD. It was created on their behalf by  Lendon Collar, a trained medical scribe. The creation of this record is based on the scribe's personal observations and the provider's statements to them. This document has been checked and approved by the attending provider.

## 2015-10-10 NOTE — Progress Notes (Signed)
Dorothy Gallagher is a very pleasant 45 y.o. female from [place], [place] with newly diagnosed invasive ductal carcinoma and ductal carcinoma in situ of the right breast.  Biopsy results revealed the tumor's prognostic profile is ER positive, PR positive, and HER2/neu negative.   She presents today with her sister to the [facility]) for treatment consideration and recommendations from the breast surgeon, radiation oncologist, and medical oncologist.     I briefly met with Dorothy Gallagher and her sister during her [facility] visit today. We discussed the purpose of the Survivorship Clinic, which will include monitoring for recurrence, coordinating completion of age and gender-appropriate cancer screenings, promotion of overall wellness, as well as managing potential late/long-term side effects of anti-cancer treatments.    The treatment plan for Dorothy Gallagher will likely include surgery, radiation therapy, and anti-estrogen therapy.  She will meet with the Genetics Counselor due to her family history of breast cancer. As of today, the intent of treatment for Dorothy Gallagher is cure, therefore she will be eligible for the Survivorship Clinic upon her completion of treatment.  Her survivorship care plan (SCP) document will be drafted and updated throughout the course of her treatment trajectory. She will receive the SCP in an office visit with myself in the Survivorship Clinic once she has completed treatment.   Dorothy Gallagher was encouraged to ask questions and all questions were answered to her satisfaction.  She was given my business card and encouraged to contact me with any concerns regarding survivorship.  I look forward to participating in her care.   Kenn File, [place] [phone]

## 2015-10-10 NOTE — Assessment & Plan Note (Signed)
Right breast mass screening detected 10:00 on 10/03/2015: 1.4 x 1.2 x 0.9 cm, ultrasound biopsy: Grade 2 invasive ductal carcinoma with DCIS ER/PR positive HER-2 negative Ki-67 10%, T1 cN0 stage IA clinical stage  Pathology and radiology counseling:Discussed with the patient, the details of pathology including the type of breast cancer,the clinical staging, the significance of ER, PR and HER-2/neu receptors and the implications for treatment. After reviewing the pathology in detail, we proceeded to discuss the different treatment options between surgery, radiation, chemotherapy, antiestrogen therapies.  Recommendations: 1. Breast conserving surgery followed by 2. Oncotype DX testing to determine if chemotherapy would be of any benefit followed by 3. Adjuvant radiation therapy followed by 4. Adjuvant antiestrogen therapy  Oncotype counseling: I discussed Oncotype DX test. I explained to the patient that this is a 21 gene panel to evaluate patient tumors DNA to calculate recurrence score. This would help determine whether patient has high risk or intermediate risk or low risk breast cancer. She understands that if her tumor was found to be high risk, she would benefit from systemic chemotherapy. If low risk, no need of chemotherapy. If she was found to be intermediate risk, we would need to evaluate the score as well as other risk factors and determine if an abbreviated chemotherapy may be of benefit.  Return to clinic after surgery to discuss final pathology report and then determine if Oncotype DX testing will need to be sent.

## 2015-10-10 NOTE — Progress Notes (Signed)
[facility] CONSULT NOTE  Patient Care Team: Cheri Fowler, MD as PCP - General (Obstetrics and Gynecology) Rolm Bookbinder, MD as Consulting Physician (General Surgery) Nicholas Lose, MD as Consulting Physician (Hematology and Oncology) Kyung Rudd, MD as Consulting Physician (Radiation Oncology)  CHIEF COMPLAINTS/PURPOSE OF CONSULTATION:  Newly diagnosed breast cancer  HISTORY OF PRESENTING ILLNESS:  Dorothy Gallagher 45 y.o. female is here because of recent diagnosis of right breast cancer. She had a screening tomogram that revealed a right breast mass at 10:00 position measuring 1.4 x 1.2 x 0.9 cm. Ultrasound-guided biopsy was performed and the final pathology came back a grade 2 invasive ductal carcinoma with DCIS that was ER/PR positive HER-2 negative with a Ki-67 of 10%. She was presented this morning at the multidisciplinary tumor board and she is here at the [facility] clinic to discuss the treatment plan.  I reviewed her records extensively and collaborated the history with the patient.  SUMMARY OF ONCOLOGIC HISTORY:   Breast cancer of upper-outer quadrant of right female breast ([place])   10/05/2015 Initial Diagnosis Right breast mass screening detected 10:00: 1.4 x 1.2 x 0.9 cm, ultrasound biopsy: Grade 2 invasive ductal carcinoma with DCIS ER/PR positive HER-2 negative Ki-67 10%, T1 cN0 stage IA clinical stage    In terms of breast cancer risk profile:  She menarched at early age of 22 and went to menopause at age 64  She had 3 pregnancy, her first child was born at age 1  She has received birth control pills for approximately 5 years.  She was never exposed to fertility medications or hormone replacement therapy.  She has  family history of Breast/GYN/GI cancer Mom age 30 had breast cancer  MEDICAL HISTORY:  Past Medical History  Diagnosis Date  . Breast cancer of upper-outer quadrant of right female breast ([place]) 10/05/2015  . Breast cancer ([place])   . Hearing loss    right ear    SURGICAL HISTORY: Past Surgical History  Procedure Laterality Date  . Cholecystectomy      SOCIAL HISTORY: Social History   Social History  . Marital Status: Married    Spouse Name: N/A  . Number of Children: N/A  . Years of Education: N/A   Occupational History  . Not on file.   Social History Main Topics  . Smoking status: Never Smoker   . Smokeless tobacco: Not on file  . Alcohol Use: Yes  . Drug Use: No  . Sexual Activity: Not on file   Other Topics Concern  . Not on file   Social History Narrative    FAMILY HISTORY: Family History  Problem Relation Age of Onset  . Breast cancer Mother 65  . Lung cancer Father   . Colon cancer Maternal Grandmother     ALLERGIES:  has No Known Allergies.  MEDICATIONS:  Current Outpatient Prescriptions  Medication Sig Dispense Refill  . ranitidine (ZANTAC) 150 MG tablet Take 150 mg by mouth 2 (two) times daily.     No current facility-administered medications for this visit.    REVIEW OF SYSTEMS:   Constitutional: Denies fevers, chills or abnormal night sweats Eyes: Denies blurriness of vision, double vision or watery eyes Ears, nose, mouth, throat, and face: Denies mucositis or sore throat Respiratory: Denies cough, dyspnea or wheezes Cardiovascular: Denies palpitation, chest discomfort or lower extremity swelling Gastrointestinal:  Denies nausea, heartburn or change in bowel habits Skin: Denies abnormal skin rashes Lymphatics: Denies new lymphadenopathy or easy bruising Neurological:Denies numbness, tingling  or new weaknesses Behavioral/Psych: Mood is stable, no new changes  Breast:  Denies any palpable lumps or discharge All other systems were reviewed with the patient and are negative.  PHYSICAL EXAMINATION: ECOG PERFORMANCE STATUS: 0 - Asymptomatic  Filed Vitals:   10/10/15 1309  BP: 143/77  Pulse: 70  Temp: 98 F (36.7 C)  Resp: 18   Filed Weights   10/10/15 1309  Weight: 201 lb 12.8  oz (91.536 kg)    GENERAL:alert, no distress and comfortable SKIN: skin color, texture, turgor are normal, no rashes or significant lesions EYES: normal, conjunctiva are pink and non-injected, sclera clear OROPHARYNX:no exudate, no erythema and lips, buccal mucosa, and tongue normal  NECK: supple, thyroid normal size, non-tender, without nodularity LYMPH:  no palpable lymphadenopathy in the cervical, axillary or inguinal LUNGS: clear to auscultation and percussion with normal breathing effort HEART: regular rate & rhythm and no murmurs and no lower extremity edema ABDOMEN:abdomen soft, non-tender and normal bowel sounds Musculoskeletal:no cyanosis of digits and no clubbing  PSYCH: alert & oriented x 3 with fluent speech NEURO: no focal motor/sensory deficits BREAST: No palpable nodules in breast. No palpable axillary or supraclavicular lymphadenopathy (exam performed in the presence of a chaperone)   LABORATORY DATA:  I have reviewed the data as listed Lab Results  Component Value Date   WBC 7.3 10/10/2015   HGB 12.6 10/10/2015   HCT 37.2 10/10/2015   MCV 85.7 10/10/2015   PLT 304 10/10/2015   Lab Results  Component Value Date   NA 141 10/10/2015   K 4.2 10/10/2015   CO2 25 10/10/2015    RADIOGRAPHIC STUDIES: I have personally reviewed the radiological reports and agreed with the findings in the report.  ASSESSMENT AND PLAN:  Breast cancer of upper-outer quadrant of right female breast ([place]) Right breast mass screening detected 10:00 on 10/03/2015: 1.4 x 1.2 x 0.9 cm, ultrasound biopsy: Grade 2 invasive ductal carcinoma with DCIS ER/PR positive HER-2 negative Ki-67 10%, T1 cN0 stage IA clinical stage  Pathology and radiology counseling:Discussed with the patient, the details of pathology including the type of breast cancer,the clinical staging, the significance of ER, PR and HER-2/neu receptors and the implications for treatment. After reviewing the pathology in detail, we  proceeded to discuss the different treatment options between surgery, radiation, chemotherapy, antiestrogen therapies.  Recommendations: 1. Breast conserving surgery followed by 2. Oncotype DX testing to determine if chemotherapy would be of any benefit followed by 3. Adjuvant radiation therapy followed by 4. Adjuvant antiestrogen therapy  Oncotype counseling: I discussed Oncotype DX test. I explained to the patient that this is a 21 gene panel to evaluate patient tumors DNA to calculate recurrence score. This would help determine whether patient has high risk or intermediate risk or low risk breast cancer. She understands that if her tumor was found to be high risk, she would benefit from systemic chemotherapy. If low risk, no need of chemotherapy. If she was found to be intermediate risk, we would need to evaluate the score as well as other risk factors and determine if an abbreviated chemotherapy may be of benefit.  Return to clinic after surgery to discuss final pathology report and then determine if Oncotype DX testing will need to be sent.     All questions were answered. The patient knows to call the clinic with any problems, questions or concerns.    Rulon Eisenmenger, MD 10/10/2015

## 2015-10-10 NOTE — Patient Instructions (Signed)

## 2015-10-10 NOTE — Therapy (Signed)
[place], [place], [address] Phone: [phone]   Fax:  [phone]  Physical Therapy Evaluation  Patient Details  Name: Dorothy Gallagher MRN: [id number] Date of Birth: [birth year] Referring Provider: Dr. Rolm Bookbinder  Encounter Date: 10/10/2015      PT End of Session - 10/10/15 1612    Visit Number 1   Number of Visits 1   PT Start Time 1400   PT Stop Time 1416  Also saw pt from 1530-1542 for a total of 28 minutes   PT Time Calculation (min) 16 min   Activity Tolerance Patient tolerated treatment well   Behavior During Therapy [facility] for tasks assessed/performed      Past Medical History  Diagnosis Date  . Breast cancer of upper-outer quadrant of right female breast ([place]) 10/05/2015  . Breast cancer ([place])   . Hearing loss     right ear    Past Surgical History  Procedure Laterality Date  . Cholecystectomy      There were no vitals filed for this visit.  Visit Diagnosis:  Carcinoma of upper-outer quadrant of right female breast [facility]) - Plan: PT plan of care cert/re-cert  Abnormal posture - Plan: PT plan of care cert/re-cert      Subjective Assessment - 10/10/15 1605    Subjective Patient reports she was diagnosed with right breast cancer and is here today for a baseline assessment of her newly diagnosed right breast cancer.   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  It is ER/PR positive, HER2 negative and has a Ki67 of 10%.  Her mass measures 1.4 cm and is located in the right upper outer quadrant.   Patient Stated Goals Reduce lymphedema risk and learn post op shoulder ROM HEP   Currently in Pain? No/denies            [facility] PT Assessment - 10/10/15 0001    Assessment   Medical Diagnosis Right breast cancer   Referring Provider Dr. Rolm Bookbinder   Onset Date/Surgical  Date 09/21/15   Hand Dominance Right   Prior Therapy none   Precautions   Precautions Other (comment)   Precaution Comments Active breast cancer   Restrictions   Weight Bearing Restrictions No   Balance Screen   Has the patient fallen in the past 6 months No   Has the patient had a decrease in activity level because of a fear of falling?  No   Is the patient reluctant to leave their home because of a fear of falling?  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   Leisure She does not exercise   Cognition   Overall Cognitive Status Within Functional Limits for tasks assessed   Posture/Postural Control   Posture/Postural Control Postural limitations   Postural Limitations Rounded Shoulders;Forward head   ROM / Strength   AROM / PROM / Strength AROM;Strength   AROM   AROM Assessment Site Shoulder   Right/Left Shoulder Right;Left   Right Shoulder Extension 42 Degrees   Right Shoulder Flexion 150 Degrees   Right Shoulder ABduction 138 Degrees   Right Shoulder Internal Rotation 74 Degrees   Right  Shoulder External Rotation 82 Degrees   Left Shoulder Extension 52 Degrees   Left Shoulder Flexion 144 Degrees   Left Shoulder ABduction 140 Degrees   Left Shoulder Internal Rotation 70 Degrees   Left Shoulder External Rotation 87 Degrees   Strength   Overall Strength Within functional limits for tasks performed           LYMPHEDEMA/ONCOLOGY QUESTIONNAIRE - 10/10/15 1610    Type   Cancer Type Right breast cancer   Lymphedema Assessments   Lymphedema Assessments Upper extremities   Right Upper Extremity Lymphedema   10 cm Proximal to Olecranon Process 34 cm   Olecranon Process 28 cm   10 cm Proximal to Ulnar Styloid Process 27 cm   Just Proximal to  Ulnar Styloid Process 16.5 cm   Across Hand at [employer] 18.9 cm   At [place] of 2nd Digit 6.4 cm   Left Upper Extremity Lymphedema   10 cm Proximal to Olecranon Process 34.3 cm   Olecranon Process 27.5 cm   10 cm Proximal to Ulnar Styloid Process 26.1 cm   Just Proximal to Ulnar Styloid Process 16.8 cm   Across Hand at [employer] 19.2 cm   At [place] of 2nd Digit 6.4 cm      Patient was instructed today in a home exercise program today for post op shoulder range of motion. These included active assist shoulder flexion in sitting, scapular retraction, wall walking with shoulder abduction, and hands behind head external rotation.  She was encouraged to do these twice a day, holding 3 seconds and repeating 5 times when permitted by her physician.         PT Education - 10/10/15 1612    Education provided Yes   Education Details Lymphedema risk reduction and post op shoulder ROM HEP   Person(s) Educated Patient;Spouse   Methods Explanation;Demonstration;Handout   Comprehension Returned demonstration;Verbalized understanding              Breast Clinic Goals - 10/10/15 1618    Patient will be able to verbalize understanding of pertinent lymphedema risk reduction practices relevant to her diagnosis specifically related to skin care.   Time 1   Period Days   Status Achieved   Patient will be able to return demonstrate and/or verbalize understanding of the post-op home exercise program related to regaining shoulder range of motion.   Time 1   Period Days   Status Achieved   Patient will be able to verbalize understanding of the importance of attending the postoperative After Breast Cancer Class for further lymphedema risk reduction education and therapeutic exercise.   Time 1   Period Days   Status Achieved              Plan - 10/10/15 1613    Clinical Impression Statement Patient was diagnosed on 09/21/15 with right grade 2 invasive ductal carcinoma with DCIS.  It  is ER/PR positive, HER2 negative and has a Ki67 of 10%.  Her mass measures 1.4 cm and is located in the right upper outer quadrant.  She is planning to have a right lumpectomy and sentinel node biopsy followed by Oncotype testing, radiation and anti-estrogen therapy.  She may benefit from post op PT to regain shoulder ROM and reduce lymphedema risk.   Pt will benefit from skilled therapeutic intervention in order to improve on the following deficits Decreased strength;Decreased knowledge of precautions;Pain;Impaired UE functional use;Decreased range of motion   Rehab Potential Excellent  Clinical Impairments Affecting Rehab Potential none   PT Frequency One time visit   PT Treatment/Interventions Therapeutic exercise;Patient/family education   PT Next Visit Plan f/u after surgery   PT Home Exercise Plan Post op shoulder ROM HEP   Consulted and Agree with Plan of Care Patient;Family member/caregiver   Family Member Consulted husband and sister     Patient will follow up at outpatient cancer rehab if needed following surgery.  If the patient requires physical therapy at that time, a specific plan will be dictated and sent to the referring physician for approval. The patient was educated today on appropriate basic range of motion exercises to begin post operatively and the importance of attending the After Breast Cancer class following surgery.  Patient was educated today on lymphedema risk reduction practices as it pertains to recommendations that will benefit the patient immediately following surgery.  She verbalized good understanding.  No additional physical therapy is indicated at this time.       Problem List Patient Active Problem List   Diagnosis Date Noted  . Breast cancer of upper-outer quadrant of right female breast ([place]) 10/05/2015    Annia Friendly, PT 10/10/2015 4:20 PM  [place], [place],  [address] Phone: [phone]   Fax:  [phone]  Name: Dorothy Gallagher MRN: [id number] Date of Birth: Feb 08, 1971

## 2015-10-10 NOTE — Progress Notes (Signed)
  [facility] Psychosocial Distress Screening Counseling Intern   Clinical Social Work was referred by distress screening protocol.  The patient scored a 10 on the Psychosocial Distress Thermometer which indicates Severe distress. Counseling Intern to assess for distress and other psychosocial needs.    ON[facility]N DISTRESS SCREENING 10/10/2015  Screening Type Initial Screening  Distress experienced in past week (1-10) 10  Practical problem type   Emotional problem type Nervousness/Anxiety;Adjusting to illness  Referral to support programs Yes   Counseling Intern Note: Met with Pt, sister, and husband during [facility]. Pt reported that her distress had decreased from a 10 to an 8 by the time of our encounter.  Pt reported that her mother had died of cancer and there are many other life stressors effecting her at the moment.  Pt stated that the information she received during [facility] made her feel hopeful.  Counseling provided supportive atmosphere and provided information about services available in  he family support center.  FU: Pt stated that she would reach out as needed.  Vaughan Sine Counseling Intern [phone]

## 2015-10-11 ENCOUNTER — Ambulatory Visit (HOSPITAL_BASED_OUTPATIENT_CLINIC_OR_DEPARTMENT_OTHER): Payer: Managed Care, Other (non HMO) | Admitting: Genetic Counselor

## 2015-10-11 ENCOUNTER — Other Ambulatory Visit: Payer: Managed Care, Other (non HMO)

## 2015-10-11 ENCOUNTER — Encounter: Payer: Self-pay | Admitting: Genetic Counselor

## 2015-10-11 DIAGNOSIS — Z86010 Personal history of colonic polyps: Secondary | ICD-10-CM | POA: Diagnosis not present

## 2015-10-11 DIAGNOSIS — Z800 Family history of malignant neoplasm of digestive organs: Secondary | ICD-10-CM | POA: Diagnosis not present

## 2015-10-11 DIAGNOSIS — Z8371 Family history of colonic polyps: Secondary | ICD-10-CM

## 2015-10-11 DIAGNOSIS — Z801 Family history of malignant neoplasm of trachea, bronchus and lung: Secondary | ICD-10-CM

## 2015-10-11 DIAGNOSIS — Z803 Family history of malignant neoplasm of breast: Secondary | ICD-10-CM | POA: Diagnosis not present

## 2015-10-11 DIAGNOSIS — Z806 Family history of leukemia: Secondary | ICD-10-CM | POA: Diagnosis not present

## 2015-10-11 DIAGNOSIS — Z809 Family history of malignant neoplasm, unspecified: Secondary | ICD-10-CM | POA: Diagnosis not present

## 2015-10-11 DIAGNOSIS — Z808 Family history of malignant neoplasm of other organs or systems: Secondary | ICD-10-CM

## 2015-10-11 DIAGNOSIS — C50411 Malignant neoplasm of upper-outer quadrant of right female breast: Secondary | ICD-10-CM | POA: Diagnosis not present

## 2015-10-11 NOTE — Progress Notes (Signed)
REFERRING PROVIDER: Nicholas Lose, MD  OTHER PROVIDER(S): Kyung Rudd, MD Rolm Bookbinder, MD  PRIMARY PROVIDER:  Clarene Duke, MD  PRIMARY REASON FOR VISIT:  1. Breast cancer of upper-outer quadrant of right female breast ([place])   2. Family history of breast cancer in mother   29. Family history of colon cancer   4. History of colonic polyps   5. Family history of colonic polyps   6. Family history of GI tract cancer   7. Family history of leukemia   8. Family history of thyroid cancer   9. Family history of lung cancer   10. Family history of cancer      HISTORY OF PRESENT ILLNESS:   Dorothy Gallagher, a 45 y.o. female, was seen for a [facility] [place] cancer genetics consultation at the request of Dr. Lindi Adie due to a personal history of breast cancer at 12 and family history of breast, colon, and other cancers.  Dorothy Gallagher presents to clinic today with her husband to discuss the possibility of a hereditary predisposition to cancer, genetic testing, and to further clarify her future cancer risks, as well as potential cancer risks for family members.   In March 2017, at the age of 21, Dorothy Gallagher was diagnosed with invasive ductal carcinoma with DCIS of the right breast. Surgical and treatment decisions are pending genetic test results.      CANCER HISTORY:    Breast cancer of upper-outer quadrant of right female breast ([place])   10/05/2015 Initial Diagnosis Right breast mass screening detected 10:00: 1.4 x 1.2 x 0.9 cm, ultrasound biopsy: Grade 2 invasive ductal carcinoma with DCIS ER/PR positive HER-2 negative Ki-67 10%, T1 cN0 stage IA clinical stage     HORMONAL RISK FACTORS:  Menarche was at age 52.  First live birth at age 22.  OCP use for approximately 15 years.  Ovaries intact: yes.  Hysterectomy: no.  Menopausal status: perimenopausal (LMP 6-8 mos ago) HRT use: 0 years. Colonoscopy: yes; most recent colonoscopy was normal; colonoscopy 8 years ago found polyps; 3-5 polyps  total. Mammogram within the last year: yes. Number of breast biopsies: 1. Up to date with pelvic exams:  yes. 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 Breast cancer of upper-outer quadrant of right female breast ([place]) 10/05/2015  . Breast cancer ([place])   . Hearing loss     right ear    Past Surgical History  Procedure Laterality Date  . Cholecystectomy      Social History   Social History  . Marital Status: Married    Spouse Name: N/A  . Number of Children: N/A  . Years of Education: N/A   Social History Main Topics  . Smoking status: Never Smoker   . Smokeless tobacco: Never Used  . Alcohol Use: Yes     Comment: "once in a blue moon"  . Drug Use: No  . Sexual Activity: Not Asked   Other Topics Concern  . None   Social History Narrative     FAMILY HISTORY:  We obtained a detailed, 4-generation family history.  Significant diagnoses are listed below: Family History  Problem Relation Age of Onset  . Breast cancer Mother 14    w/ metastasis  . Other Mother     hx of partial hysterectomy; hx of ovarian cysts  . Lung cancer Father 58  former smoker  . Colon cancer Maternal Grandmother     dx. 18s  . Fibrocystic breast disease Sister   . Colon polyps Sister     approx 1-3  . Colon cancer Maternal Uncle   . Colon polyps Sister     1-3  . Fibrocystic breast disease Sister     cysts "enlarge and go down"  . Cancer Maternal Aunt     NOS; d. 76s  . Cancer Maternal Uncle     blood, liver, or pancreatic cancer; dx. late 60s-early 70s  . Lung cancer Maternal Uncle     smoker; d. late 98s  . Leukemia Cousin     maternal 1st cousin dx. late 85s  . Breast cancer Cousin     s/p lumpectomy; dx. late 72s or earlier  . Thyroid cancer Cousin 19    maternal 1st cousin; s/p thyroidectomy  . Other Paternal Grandmother     hx  of "benign 40-lb stomach tumor" that was removed  . Heart attack Paternal Grandfather 34  . Cancer Cousin 21    "knot on leg" that metastasized to bone cancer    Dorothy Gallagher has one son who is currently 30.  He has a baby of his own on the way--due in August 2017.  Dorothy Gallagher also has two daughters who are 63 and 29 years of age.  Dorothy Gallagher has one brother who died in a motorcycle accident at the age of 22.  She also has two sisters who are both in their early 67s and who have never been diagnosed with breast cancer.  Both sisters have fibrocystic breasts and both have a history of approximately 1-3 polyps.  Dorothy Gallagher mother died of cancer at the age of 62.  She was first diagnosed with breast cancer at the age of 32.  Dorothy Gallagher's mother had a history of cysts on her ovaries, and had a history of a partial hysterectomy.  Dorothy Gallagher father is currently 83.  He was diagnosed with lung cancer at the age of 68; he is a former smoker.    Dorothy Gallagher mother had two full sisters and five full brothers.  Only two brothers are still living--they are both in their late 80s-early 90s and have never had cancer.  One brother was died of colon cancer at the age of 42.  He has children, but Dorothy Gallagher had no information for these cousins.  Another brother died of an unspecified type of cancer in his late 72s-early 70s, that cause his stomach to swell and may have been either a blood, liver, or pancreatic cancer.  This brother had one son and two daughters.  One of his daughters had leukemia in her late 51s.  Another maternal uncle, a heavy smoker, died of lung cancer in his late 66s.  He had one son and four daughters--one daughter was diagnosed with breast cancer in or before her late 86s.  One maternal aunt died in her late 38s.  Another maternal aunt died of an unspecified type of cancer in her 48s.  One maternal first cousin was diagnosed with thyroid cancer at the age of 40.  Dorothy Gallagher maternal grandmother died of  colon cancer in her 27s.  She had at least one sister, but Dorothy Gallagher has no further information for any maternal great aunt/uncles or great grandparents.  Dorothy Gallagher maternal grandfather died in a car accident at an unspecified age.  Dorothy Gallagher is unaware of any history of  genetic testing for any of her maternal relatives.  Dorothy Gallagher father has three full brothers and two full sisters, most of whom are still living (there is one paternal uncle which the family has no information for).  All of these aunts/uncles are in their 70s-80s and have never had cancer.  One paternal first cousin died of bone cancer at the age of 30.  This cancer reportedly started as a "knot on his leg".  He had no brothers/sisters.  Dorothy Gallagher paternal grandmother died in her late 30s; she had a history of a "benign 40-lb stomach tumor" which was surgically removed.  Dorothy Gallagher paternal grandfather died of a heart attack at the age of 75.  She has no further information for any paternal aunts/uncles or great grandparents.    Patient's maternal and paternal ancestors are of Caucasian descent. There is no reported Ashkenazi Jewish ancestry. There is no known consanguinity.  GENETIC COUNSELING ASSESSMENT: KHALA TARTE is a 45 y.o. female with a personal and family history of cancer which is somewhat suggestive of a hereditary cancer syndrome and predisposition to cancer. We, therefore, discussed and recommended the following at today's visit.   DISCUSSION: We reviewed the characteristics, features and inheritance patterns of hereditary cancer syndromes, particularly those caused by mutations within the BRCA1/2, CHEK2, and Lynch syndrome genes. We also discussed genetic testing, including the appropriate family members to test, the process of testing, insurance coverage and turn-around-time for results. We discussed the implications of a negative, positive and/or variant of uncertain significant result. We recommended Dorothy Gallagher Hammers  pursue genetic testing for the 42-gene [employer] Common Hereditary Cancers Panel (Breast, Gyn, GI) through [facility].  The 42-gene [facility] Common Hereditary Cancers Panel (Breast, Gyn, GI) performed by [facility], [place]) includes sequencing and/or deletion/duplication analysis for the following genes: APC, ATM, AXIN2, BARD1, BMPR1A, BRCA1, BRCA2, BRIP1, CDH1, CDKN2A, CHEK2, DICER1, EPCAM, GREM1, KIT, MEN1, MLH1, MSH2, MSH6, MUTYH, NBN, NF1, PALB2, PDGFRA, PMS2, POLD1, POLE, PTEN, RAD50, RAD51C, RAD51D, SDHA, SDHB, SDHC, SDHD, SMAD4, SMARCA4, STK11, TP53, TSC1, TSC2, and VHL.   Based on Ms. Staup's personal and family history of cancer, she meets medical criteria for genetic testing. Despite that she meets criteria, she may still have an out of pocket cost. We discussed that if her out of pocket cost for testing is over $100, the laboratory will call and confirm whether she wants to proceed with testing.  If the out of pocket cost of testing is less than $100 she will be billed by the genetic testing laboratory.   PLAN: After considering the risks, benefits, and limitations, Ms. Glaab  provided informed consent to pursue genetic testing and the blood sample was sent to [facility] for analysis of the 42-gene [employer] Common Hereditary Cancers Panel (Breast, Gyn, GI). Results should be available within approximately 2 weeks' time, at which point they will be disclosed by telephone to Ms. Chalker, as will any additional recommendations warranted by these results. Ms. Greenwood will receive a summary of her genetic counseling visit and a copy of her results once available. This information will also be available in Epic. We encouraged Ms. Dudzik to remain in contact with cancer genetics annually so that we can continuously update the family history and inform her of any changes in cancer genetics and testing that may be of benefit for her family. Ms. Vogelgesang questions were answered to  her satisfaction today. Our contact information was provided should additional questions or concerns arise.  Thank you for the referral and allowing [name] to share in the care of your patient.   Jeanine Luz, MS, [facility] Certified Genetic Counselor [email or web address] Phone: [phone]  The patient was seen for a total of 60 minutes in face-to-face genetic counseling.  This patient was discussed with Drs. Magrinat, Lindi Adie and/or Burr Medico who agrees with the above.    _______________________________________________________________________ For Office Staff:  Number of people involved in session: 2 Was an Intern/ student involved with case: no

## 2015-10-12 ENCOUNTER — Other Ambulatory Visit: Payer: Self-pay | Admitting: General Surgery

## 2015-10-12 ENCOUNTER — Telehealth: Payer: Self-pay | Admitting: *Deleted

## 2015-10-12 DIAGNOSIS — C50411 Malignant neoplasm of upper-outer quadrant of right female breast: Secondary | ICD-10-CM

## 2015-10-12 NOTE — Telephone Encounter (Signed)
  Oncology Nurse Navigator Documentation  Navigator Location: [facility] Onc (10/12/15 1700) Navigator Encounter Type: Telephone (10/12/15 1700)  Patient called to ask if she should cancel her vacation plans on 01/26/16 due to her upcoming treatment and surgery.  She reports that Dr. Donne Hazel was going to schedule her lumpectomy and if the results of genetic testing are positive, see her and discuss a change in plan.  She wanted to know if the lumpectomy had been scheduled.  We discussed a possible time line for treatment if she has a lumpectomy to help her decide about her vacation plans.  I called Colletta Maryland about scheduling the lumpectomy and she was going to call patient and discuss. I called patient back to make sure she had talked with Colletta Maryland and left a voice mail message.  Patient called me back and said that she had talked with Colletta Maryland and thanked me for the assistance.             Barriers/Navigation Needs: Coordination of Care (10/12/15 1700)   Interventions: Coordination of Care (10/12/15 1700)                      Time Spent with Patient: 15 (10/12/15 1700)

## 2015-10-16 ENCOUNTER — Encounter: Payer: Self-pay | Admitting: *Deleted

## 2015-10-17 ENCOUNTER — Telehealth: Payer: Self-pay | Admitting: Hematology and Oncology

## 2015-10-17 NOTE — Telephone Encounter (Signed)
lvm for pt regarding to may appt.... °

## 2015-10-23 ENCOUNTER — Encounter (HOSPITAL_BASED_OUTPATIENT_CLINIC_OR_DEPARTMENT_OTHER): Payer: Self-pay | Admitting: *Deleted

## 2015-10-24 ENCOUNTER — Telehealth: Payer: Self-pay | Admitting: Genetic Counselor

## 2015-10-24 NOTE — Telephone Encounter (Signed)
Discussed with Dorothy Gallagher that her genetic test results were negative for pathogenic mutations within any of 42 genes that would cause her to be at an increased risk for breast, ovarian, colon, or other related cancers.  Additionally, no uncertain changes were found.  Discussed that, though we cannot rule out a genetic cause for the personal and family history of cancer, that most likely this is a reassuring result for [name].  Dorothy Gallagher should continue to follow her doctors' recommendations for future cancer screening.  Encouraged her to call and update the family history with [name] in the future and/or to call and find out about updated testing options in the future, if she is interested.  Dorothy Gallagher asked if her sisters would also need genetic testing and we discussed that no most likely they would not because it would be very unlikely that they would test positive when she has had breast cancer and has already tested negative.  Discussed that her sisters may be eligible for additional breast cancer screening in the future, such as breast MRI, since now their sister and mother have both had breast cancer.  Discussed that Dorothy Gallagher's daughters and her nieces could begin having annual mammograms at the age of 39 based on current recommendations and her own age at diagnosis.  Dorothy Gallagher is welcome to call or email with any questions.

## 2015-10-25 ENCOUNTER — Ambulatory Visit: Payer: Self-pay | Admitting: Genetic Counselor

## 2015-10-25 DIAGNOSIS — Z1379 Encounter for other screening for genetic and chromosomal anomalies: Secondary | ICD-10-CM

## 2015-10-25 DIAGNOSIS — Z809 Family history of malignant neoplasm, unspecified: Secondary | ICD-10-CM

## 2015-10-25 DIAGNOSIS — Z860100 Personal history of colon polyps, unspecified: Secondary | ICD-10-CM

## 2015-10-25 DIAGNOSIS — Z803 Family history of malignant neoplasm of breast: Secondary | ICD-10-CM

## 2015-10-25 DIAGNOSIS — Z800 Family history of malignant neoplasm of digestive organs: Secondary | ICD-10-CM

## 2015-10-25 DIAGNOSIS — C50411 Malignant neoplasm of upper-outer quadrant of right female breast: Secondary | ICD-10-CM

## 2015-10-29 ENCOUNTER — Ambulatory Visit
Admission: RE | Admit: 2015-10-29 | Discharge: 2015-10-29 | Disposition: A | Discharge status: Home / Self Care | Payer: Managed Care, Other (non HMO) | Source: Ambulatory Visit | Attending: General Surgery | Admitting: General Surgery

## 2015-10-29 DIAGNOSIS — C50411 Malignant neoplasm of upper-outer quadrant of right female breast: Secondary | ICD-10-CM

## 2015-10-31 ENCOUNTER — Encounter (HOSPITAL_COMMUNITY)
Admission: RE | Admit: 2015-10-31 | Discharge: 2015-10-31 | Disposition: A | Discharge status: Home / Self Care | Payer: Managed Care, Other (non HMO) | Source: Ambulatory Visit | Attending: General Surgery | Admitting: General Surgery

## 2015-10-31 ENCOUNTER — Encounter (HOSPITAL_BASED_OUTPATIENT_CLINIC_OR_DEPARTMENT_OTHER)
Admission: RE | Disposition: A | Discharge status: Home / Self Care | Payer: Self-pay | Source: Ambulatory Visit | Attending: General Surgery

## 2015-10-31 ENCOUNTER — Encounter (HOSPITAL_BASED_OUTPATIENT_CLINIC_OR_DEPARTMENT_OTHER): Payer: Self-pay | Admitting: Anesthesiology

## 2015-10-31 ENCOUNTER — Ambulatory Visit (HOSPITAL_BASED_OUTPATIENT_CLINIC_OR_DEPARTMENT_OTHER)
Admission: RE | Admit: 2015-10-31 | Discharge: 2015-10-31 | Disposition: A | Discharge status: Home / Self Care | Payer: Managed Care, Other (non HMO) | Source: Ambulatory Visit | Attending: General Surgery | Admitting: General Surgery

## 2015-10-31 ENCOUNTER — Ambulatory Visit
Admission: RE | Admit: 2015-10-31 | Discharge: 2015-10-31 | Disposition: A | Discharge status: Home / Self Care | Payer: Managed Care, Other (non HMO) | Source: Ambulatory Visit | Attending: General Surgery | Admitting: General Surgery

## 2015-10-31 ENCOUNTER — Ambulatory Visit (HOSPITAL_BASED_OUTPATIENT_CLINIC_OR_DEPARTMENT_OTHER): Payer: Managed Care, Other (non HMO) | Admitting: Anesthesiology

## 2015-10-31 DIAGNOSIS — C50411 Malignant neoplasm of upper-outer quadrant of right female breast: Secondary | ICD-10-CM

## 2015-10-31 DIAGNOSIS — Z803 Family history of malignant neoplasm of breast: Secondary | ICD-10-CM | POA: Insufficient documentation

## 2015-10-31 DIAGNOSIS — C50911 Malignant neoplasm of unspecified site of right female breast: Secondary | ICD-10-CM | POA: Diagnosis present

## 2015-10-31 DIAGNOSIS — D0511 Intraductal carcinoma in situ of right breast: Secondary | ICD-10-CM | POA: Diagnosis not present

## 2015-10-31 HISTORY — PX: RADIOACTIVE SEED GUIDED PARTIAL MASTECTOMY WITH AXILLARY SENTINEL LYMPH NODE BIOPSY: SHX6520

## 2015-10-31 HISTORY — DX: Snoring: R06.83

## 2015-10-31 HISTORY — DX: Gastro-esophageal reflux disease without esophagitis: K21.9

## 2015-10-31 HISTORY — PX: BREAST LUMPECTOMY: SHX2

## 2015-10-31 SURGERY — RADIOACTIVE SEED GUIDED PARTIAL MASTECTOMY WITH AXILLARY SENTINEL LYMPH NODE BIOPSY
Anesthesia: General | Site: Breast | Laterality: Right

## 2015-10-31 MED ORDER — HYDROCODONE-ACETAMINOPHEN 5-325 MG PO TABS
ORAL_TABLET | ORAL | Status: AC
  Filled 2015-10-31: qty 1

## 2015-10-31 MED ORDER — HYDROMORPHONE HCL 1 MG/ML IJ SOLN: 0.2500 mg | INTRAMUSCULAR | Status: DC | PRN

## 2015-10-31 MED ORDER — PROPOFOL 500 MG/50ML IV EMUL
INTRAVENOUS | Status: AC
  Filled 2015-10-31: qty 50

## 2015-10-31 MED ORDER — ONDANSETRON HCL 4 MG/2ML IJ SOLN
INTRAMUSCULAR | Status: DC | PRN
  Administered 2015-10-31: 4 mg via INTRAVENOUS

## 2015-10-31 MED ORDER — LACTATED RINGERS IV SOLN
INTRAVENOUS | Status: DC
  Administered 2015-10-31: 10 mL/h via INTRAVENOUS
  Administered 2015-10-31: 09:00:00 via INTRAVENOUS

## 2015-10-31 MED ORDER — MEPERIDINE HCL 25 MG/ML IJ SOLN: 6.2500 mg | INTRAMUSCULAR | Status: DC | PRN

## 2015-10-31 MED ORDER — SCOPOLAMINE 1 MG/3DAYS TD PT72: 1.0000 | MEDICATED_PATCH | Freq: Once | TRANSDERMAL | Status: DC | PRN

## 2015-10-31 MED ORDER — LIDOCAINE HCL (CARDIAC) 20 MG/ML IV SOLN
INTRAVENOUS | Status: DC | PRN
  Administered 2015-10-31: 20 mg via INTRAVENOUS

## 2015-10-31 MED ORDER — MIDAZOLAM HCL 2 MG/2ML IJ SOLN: 0.5000 mg | Freq: Once | INTRAMUSCULAR | Status: DC | PRN

## 2015-10-31 MED ORDER — HYDROCODONE-ACETAMINOPHEN 10-325 MG PO TABS: 1.0000 | ORAL_TABLET | Freq: Four times a day (QID) | ORAL | Status: DC | PRN

## 2015-10-31 MED ORDER — DEXAMETHASONE SODIUM PHOSPHATE 4 MG/ML IJ SOLN
INTRAMUSCULAR | Status: DC | PRN
  Administered 2015-10-31: 10 mg via INTRAVENOUS

## 2015-10-31 MED ORDER — MIDAZOLAM HCL 2 MG/2ML IJ SOLN
1.0000 mg | INTRAMUSCULAR | Status: DC | PRN
  Administered 2015-10-31: 2 mg via INTRAVENOUS

## 2015-10-31 MED ORDER — GLYCOPYRROLATE 0.2 MG/ML IJ SOLN: 0.2000 mg | Freq: Once | INTRAMUSCULAR | Status: DC | PRN

## 2015-10-31 MED ORDER — FENTANYL CITRATE (PF) 100 MCG/2ML IJ SOLN
50.0000 ug | INTRAMUSCULAR | Status: DC | PRN
  Administered 2015-10-31 (×2): 100 ug via INTRAVENOUS

## 2015-10-31 MED ORDER — CEFAZOLIN SODIUM-DEXTROSE 2-4 GM/100ML-% IV SOLN
INTRAVENOUS | Status: AC
  Filled 2015-10-31: qty 100

## 2015-10-31 MED ORDER — DEXTROSE 5 % IV SOLN
2.0000 g | INTRAVENOUS | Status: AC
  Administered 2015-10-31: 2 g via INTRAVENOUS

## 2015-10-31 MED ORDER — BUPIVACAINE HCL (PF) 0.25 % IJ SOLN
INTRAMUSCULAR | Status: DC | PRN
  Administered 2015-10-31: 7 mL

## 2015-10-31 MED ORDER — TECHNETIUM TC 99M SULFUR COLLOID FILTERED
1.0000 | Freq: Once | INTRAVENOUS | Status: AC | PRN
  Administered 2015-10-31: 1 via INTRADERMAL

## 2015-10-31 MED ORDER — HYDROCODONE-ACETAMINOPHEN 5-325 MG PO TABS
1.0000 | ORAL_TABLET | Freq: Once | ORAL | Status: AC
Start: 2015-10-31 — End: 2015-10-31
  Administered 2015-10-31: 1 via ORAL

## 2015-10-31 MED ORDER — DEXAMETHASONE SODIUM PHOSPHATE 10 MG/ML IJ SOLN
INTRAMUSCULAR | Status: AC
  Filled 2015-10-31: qty 1

## 2015-10-31 MED ORDER — FENTANYL CITRATE (PF) 100 MCG/2ML IJ SOLN
INTRAMUSCULAR | Status: AC
  Filled 2015-10-31: qty 2

## 2015-10-31 MED ORDER — KETOROLAC TROMETHAMINE 30 MG/ML IJ SOLN
INTRAMUSCULAR | Status: DC | PRN
  Administered 2015-10-31: 30 mg via INTRAVENOUS

## 2015-10-31 MED ORDER — ONDANSETRON HCL 4 MG/2ML IJ SOLN
INTRAMUSCULAR | Status: AC
  Filled 2015-10-31: qty 2

## 2015-10-31 MED ORDER — SODIUM CHLORIDE 0.9 % IJ SOLN
INTRAMUSCULAR | Status: AC
  Filled 2015-10-31: qty 10

## 2015-10-31 MED ORDER — PROMETHAZINE HCL 25 MG/ML IJ SOLN: 6.2500 mg | INTRAMUSCULAR | Status: DC | PRN

## 2015-10-31 MED ORDER — BUPIVACAINE HCL (PF) 0.25 % IJ SOLN
INTRAMUSCULAR | Status: AC
  Filled 2015-10-31: qty 30

## 2015-10-31 MED ORDER — BUPIVACAINE-EPINEPHRINE (PF) 0.25% -1:200000 IJ SOLN
INTRAMUSCULAR | Status: AC
  Filled 2015-10-31: qty 30

## 2015-10-31 MED ORDER — PROPOFOL 10 MG/ML IV BOLUS
INTRAVENOUS | Status: DC | PRN
  Administered 2015-10-31: 200 mg via INTRAVENOUS

## 2015-10-31 MED ORDER — MIDAZOLAM HCL 2 MG/2ML IJ SOLN
INTRAMUSCULAR | Status: AC
  Filled 2015-10-31: qty 2

## 2015-10-31 MED ORDER — BUPIVACAINE-EPINEPHRINE (PF) 0.5% -1:200000 IJ SOLN
INTRAMUSCULAR | Status: DC | PRN
  Administered 2015-10-31: 30 mL

## 2015-10-31 MED ORDER — LIDOCAINE HCL (CARDIAC) 20 MG/ML IV SOLN
INTRAVENOUS | Status: AC
  Filled 2015-10-31: qty 5

## 2015-10-31 SURGICAL SUPPLY — 61 items
APPLIER CLIP 9.375 MED OPEN (MISCELLANEOUS) ×2
APR CLP MED 9.3 20 MLT OPN (MISCELLANEOUS) ×1
BINDER BREAST LRG (GAUZE/BANDAGES/DRESSINGS) IMPLANT
BINDER BREAST MEDIUM (GAUZE/BANDAGES/DRESSINGS) IMPLANT
BINDER BREAST XLRG (GAUZE/BANDAGES/DRESSINGS) IMPLANT
BINDER BREAST XXLRG (GAUZE/BANDAGES/DRESSINGS) IMPLANT
BLADE SURG 15 STRL LF DISP TIS (BLADE) ×1 IMPLANT
BLADE SURG 15 STRL SS (BLADE) ×2
CANISTER SUC SOCK COL 7IN (MISCELLANEOUS) IMPLANT
CANISTER SUCT 1200ML W/VALVE (MISCELLANEOUS) ×1 IMPLANT
CHLORAPREP W/TINT 26ML (MISCELLANEOUS) ×2 IMPLANT
CLIP APPLIE 9.375 MED OPEN (MISCELLANEOUS) ×1 IMPLANT
COVER BACK TABLE 60X90IN (DRAPES) ×2 IMPLANT
COVER MAYO STAND STRL (DRAPES) ×2 IMPLANT
COVER PROBE W GEL 5X96 (DRAPES) ×2 IMPLANT
DECANTER SPIKE VIAL GLASS SM (MISCELLANEOUS) IMPLANT
DEVICE DUBIN W/COMP PLATE 8390 (MISCELLANEOUS) ×2 IMPLANT
DRAPE LAPAROSCOPIC ABDOMINAL (DRAPES) ×2 IMPLANT
DRAPE UTILITY XL STRL (DRAPES) ×2 IMPLANT
ELECT COATED BLADE 2.86 ST (ELECTRODE) ×2 IMPLANT
ELECT REM PT RETURN 9FT ADLT (ELECTROSURGICAL) ×2
ELECTRODE REM PT RTRN 9FT ADLT (ELECTROSURGICAL) ×1 IMPLANT
GLOVE BIO SURGEON STRL SZ7 (GLOVE) ×4 IMPLANT
GLOVE BIOGEL PI IND STRL 7.0 (GLOVE) IMPLANT
GLOVE BIOGEL PI IND STRL 7.5 (GLOVE) ×1 IMPLANT
GLOVE BIOGEL PI INDICATOR 7.0 (GLOVE) ×1
GLOVE BIOGEL PI INDICATOR 7.5 (GLOVE) ×1
GLOVE SURG SS PI 6.5 STRL IVOR (GLOVE) ×1 IMPLANT
GOWN STRL REUS W/ TWL LRG LVL3 (GOWN DISPOSABLE) ×2 IMPLANT
GOWN STRL REUS W/TWL LRG LVL3 (GOWN DISPOSABLE) ×4
HEMOSTAT ARISTA ABSORB 3G PWDR (MISCELLANEOUS) IMPLANT
ILLUMINATOR WAVEGUIDE N/F (MISCELLANEOUS) IMPLANT
KIT MARKER MARGIN INK (KITS) ×2 IMPLANT
LIGHT WAVEGUIDE WIDE FLAT (MISCELLANEOUS) IMPLANT
LIQUID BAND (GAUZE/BANDAGES/DRESSINGS) ×2 IMPLANT
NDL HYPO 25X1 1.5 SAFETY (NEEDLE) ×1 IMPLANT
NDL SAFETY ECLIPSE 18X1.5 (NEEDLE) IMPLANT
NEEDLE HYPO 18GX1.5 SHARP (NEEDLE)
NEEDLE HYPO 25X1 1.5 SAFETY (NEEDLE) ×2 IMPLANT
NS IRRIG 1000ML POUR BTL (IV SOLUTION) IMPLANT
PACK BASIN DAY SURGERY FS (CUSTOM PROCEDURE TRAY) ×2 IMPLANT
PENCIL BUTTON HOLSTER BLD 10FT (ELECTRODE) ×2 IMPLANT
SHEET MEDIUM DRAPE 40X70 STRL (DRAPES) IMPLANT
SLEEVE SCD COMPRESS KNEE MED (MISCELLANEOUS) ×2 IMPLANT
SPONGE LAP 4X18 X RAY DECT (DISPOSABLE) ×2 IMPLANT
STOCKINETTE IMPERVIOUS LG (DRAPES) IMPLANT
STRIP CLOSURE SKIN 1/2X4 (GAUZE/BANDAGES/DRESSINGS) ×2 IMPLANT
SUT ETHILON 2 0 FS 18 (SUTURE) IMPLANT
SUT MNCRL AB 4-0 PS2 18 (SUTURE) ×2 IMPLANT
SUT MON AB 5-0 PS2 18 (SUTURE) IMPLANT
SUT SILK 2 0 SH (SUTURE) IMPLANT
SUT VIC AB 2-0 SH 27 (SUTURE) ×2
SUT VIC AB 2-0 SH 27XBRD (SUTURE) ×1 IMPLANT
SUT VIC AB 3-0 SH 27 (SUTURE) ×2
SUT VIC AB 3-0 SH 27X BRD (SUTURE) ×1 IMPLANT
SUT VIC AB 5-0 PS2 18 (SUTURE) IMPLANT
SYR CONTROL 10ML LL (SYRINGE) ×2 IMPLANT
TOWEL OR 17X24 6PK STRL BLUE (TOWEL DISPOSABLE) ×2 IMPLANT
TOWEL OR NON WOVEN STRL DISP B (DISPOSABLE) ×2 IMPLANT
TUBE CONNECTING 20X1/4 (TUBING) IMPLANT
YANKAUER SUCT BULB TIP NO VENT (SUCTIONS) IMPLANT

## 2015-10-31 NOTE — Discharge Instructions (Signed)
[facility] Office Phone Number [phone]   POST OP INSTRUCTIONS  Always review your discharge instruction sheet given to you by the facility where your surgery was performed.  IF YOU HAVE DISABILITY OR FAMILY LEAVE FORMS, YOU MUST BRING THEM TO THE OFFICE FOR PROCESSING.  DO NOT GIVE THEM TO YOUR DOCTOR.  1. A prescription for pain medication may be given to you upon discharge.  Take your pain medication as prescribed, if needed.  If narcotic pain medicine is not needed, then you may take acetaminophen (Tylenol), naprosyn (Alleve) or ibuprofen (Advil) as needed. i encourage the use of non narcotic pain medications and ice packs. 2. Take your usually prescribed medications unless otherwise directed 3. If you need a refill on your pain medication, please contact your pharmacy.  They will contact our office to request authorization.  Prescriptions will not be filled after 5pm or on week-ends. 4. You should eat very light the first 24 hours after surgery, such as soup, crackers, pudding, etc.  Resume your normal diet the day after surgery. 5. Most patients will experience some swelling and bruising in the breast.  Ice packs and a good support bra will help.  Wear the breast binder provided or a sports bra for 72 hours day and night.  After that wear a sports bra during the day until you return to the office. Swelling and bruising can take several days to resolve.  6. It is common to experience some constipation if taking pain medication after surgery.  Increasing fluid intake and taking a stool softener will usually help or prevent this problem from occurring.  A mild laxative (Milk of Magnesia or Miralax) should be taken according to package directions if there are no bowel movements after 48 hours. 6. You  have steri-strips (small skin tapes) in place directly over the incision.  These strips should be left on the skin for 7-10 days and will come off on their own.  If your surgeon  used skin glue on the incision, you may shower in 24 hours.  The glue will flake off over the next 2-3 weeks.   7. ACTIVITIES:  You may resume regular daily activities (gradually increasing) beginning the next day.  Wearing a good support bra or sports bra minimizes pain and swelling.  You may have sexual intercourse when it is comfortable. a. You may drive when you no longer are taking prescription pain medication, you can comfortably wear a seatbelt, and you can safely maneuver your car and apply brakes. b. RETURN TO WORK:  ______________________________________________________________________________________ 8. You should see your doctor in the office for a follow-up appointment approximately two-three weeks after your surgery.  Your doctors nurse will typically make your follow-up appointment when she calls you with your pathology report.  Expect your pathology report 3-4 business days after your surgery.  You may call to check if you do not hear from [name] after three days. 9. OTHER INSTRUCTIONS: _______________________________________________________________________________________________ _____________________________________________________________________________________________________________________________________ _____________________________________________________________________________________________________________________________________ _____________________________________________________________________________________________________________________________________  WHEN TO CALL DR WAKEFIELD: 1. Fever over 101.0 2. Nausea and/or vomiting. 3. Extreme swelling or bruising. 4. Continued bleeding from incision. 5. Increased pain, redness, or drainage from the incision.  The clinic staff is available to answer your questions during regular business hours.  Please dont hesitate to call and ask to speak to one of the nurses for clinical concerns.  If you have a medical emergency, go  to the nearest emergency room or call 911.  A surgeon from [facility] is  always on call at the hospital.  For further questions, please visit [email or web address]   Post Anesthesia Home Care Instructions  Activity: Get plenty of rest for the remainder of the day. A responsible adult should stay with you for 24 hours following the procedure.  For the next 24 hours, DO NOT: -Drive a car -Paediatric nurse -Drink alcoholic beverages -Take any medication unless instructed by your physician -Make any legal decisions or sign important papers.  Meals: Start with liquid foods such as gelatin or soup. Progress to regular foods as tolerated. Avoid greasy, spicy, heavy foods. If nausea and/or vomiting occur, drink only clear liquids until the nausea and/or vomiting subsides. Call your physician if vomiting continues.  Special Instructions/Symptoms: Your throat may feel dry or sore from the anesthesia or the breathing tube placed in your throat during surgery. If this causes discomfort, gargle with warm salt water. The discomfort should disappear within 24 hours.  If you had a scopolamine patch placed behind your ear for the management of post- operative nausea and/or vomiting:  1. The medication in the patch is effective for 72 hours, after which it should be removed.  Wrap patch in a tissue and discard in the trash. Wash hands thoroughly with soap and water. 2. You may remove the patch earlier than 72 hours if you experience unpleasant side effects which may include dry mouth, dizziness or visual disturbances. 3. Avoid touching the patch. Wash your hands with soap and water after contact with the patch.

## 2015-10-31 NOTE — Transfer of Care (Signed)
Immediate Anesthesia Transfer of Care Note  Patient: Dorothy Gallagher  Procedure(s) Performed: Procedure(s): RADIOACTIVE SEED GUIDED RIGHT PARTIAL MASTECTOMY WITH RIGHT AXILLARY SENTINEL LYMPH NODE BIOPSY (Right)  Patient Location: PACU  Anesthesia Type:GA combined with regional for post-op pain  Level of Consciousness: sedated  Airway & Oxygen Therapy: Patient Spontanous Breathing and Patient connected to face mask oxygen  Post-op Assessment: Report given to RN and Post -op Vital signs reviewed and stable  Post vital signs: Reviewed and stable  Last Vitals:  Filed Vitals:   10/31/15 0805 10/31/15 0810  BP:    Pulse: 73 79  Temp:    Resp: 11 11    Complications: No apparent anesthesia complications

## 2015-10-31 NOTE — Progress Notes (Signed)
Assisted Dr. Annye Asa with right, ultrasound guided, pectoralis block. Side rails up, monitors on throughout procedure. See vital signs in flow sheet. Tolerated Procedure well.

## 2015-10-31 NOTE — Anesthesia Postprocedure Evaluation (Signed)
Anesthesia Post Note  Patient: Dorothy Gallagher  Procedure(s) Performed: Procedure(s) (LRB): RADIOACTIVE SEED GUIDED RIGHT PARTIAL MASTECTOMY WITH RIGHT AXILLARY SENTINEL LYMPH NODE BIOPSY (Right)  Patient location during evaluation: PACU Anesthesia Type: General and Regional Level of consciousness: awake and alert Pain management: pain level controlled Vital Signs Assessment: post-procedure vital signs reviewed and stable Respiratory status: spontaneous breathing, nonlabored ventilation, respiratory function stable and patient connected to nasal cannula oxygen Cardiovascular status: blood pressure returned to baseline and stable Postop Assessment: no signs of nausea or vomiting Anesthetic complications: no    Last Vitals:  Filed Vitals:   10/31/15 1027 10/31/15 1115  BP:  136/82  Pulse: 63 61  Temp:  36.6 C  Resp: 13 16    Last Pain:  Filed Vitals:   10/31/15 1122  PainSc: 3                  Montez Hageman

## 2015-10-31 NOTE — Anesthesia Procedure Notes (Addendum)
Anesthesia Regional Block:  Pectoralis block  Pre-Anesthetic Checklist: ,, timeout performed, Correct Patient, Correct Site, Correct Laterality, Correct Procedure, Correct Position, site marked, Risks and benefits discussed,  Surgical consent,  Pre-op evaluation,  At surgeon's request and post-op pain management  Laterality: Right  Prep: chloraprep       Needles:  Injection technique: Single-shot  Needle Type: Echogenic Needle     Needle Length: 9cm 9 cm Needle Gauge: 22 and 22 G    Additional Needles:  Procedures: ultrasound guided (picture in chart) Pectoralis block Narrative:  Start time: 10/31/2015 8:00 AM End time: 10/31/2015 8:09 AM Injection made incrementally with aspirations every 5 mL.  Performed by: Personally  Anesthesiologist: Glennon Mac, [place]  Additional Notes: Pt identified in Holding room.  Monitors applied. Working IV access confirmed. Sterile prep, drape R pec/chest.  #[id number] into sub pec space between ribs 4,5 with US guidance.  30cc 0.5% Bupivacaine with 1:200k epi injected incrementally after negative test dose, good spread of local.  Additional 5cc between pec minor and major.  Patient asymptomatic, VSS, no heme aspirated, tolerated well.    Jenita Seashore, MD   Procedure Name: LMA Insertion Date/Time: 10/31/2015 8:32 AM Performed by: Maryella Shivers Pre-anesthesia Checklist: Patient identified, Emergency Drugs available, Suction available and Patient being monitored Patient Re-evaluated:Patient Re-evaluated prior to inductionOxygen Delivery Method: Circle System Utilized Preoxygenation: Pre-oxygenation with 100% oxygen Intubation Type: IV induction Ventilation: Mask ventilation without difficulty LMA: LMA inserted LMA Size: 4.0 Number of attempts: 1 Airway Equipment and Method: Bite block Placement Confirmation: positive ETCO2 Tube secured with: Tape Dental Injury: Teeth and Oropharynx as per pre-operative assessment

## 2015-10-31 NOTE — Anesthesia Preprocedure Evaluation (Addendum)
Anesthesia Evaluation  Patient identified by MRN, date of birth, ID band Patient awake    Reviewed: Allergy & Precautions, NPO status , Patient's Chart, lab work & pertinent test results  History of Anesthesia Complications Negative for: history of anesthetic complications  Airway Mallampati: III  TM Distance: >3 FB Neck ROM: Full    Dental  (+) Dental Advisory Given   Pulmonary neg pulmonary ROS,    Pulmonary exam normal        Cardiovascular negative cardio ROS   Rhythm:Regular Rate:Normal     Neuro/Psych negative neurological ROS     GI/Hepatic Neg liver ROS, GERD  Medicated and Controlled,  Endo/Other  Morbid obesity  Renal/GU negative Renal ROS     Musculoskeletal negative musculoskeletal ROS (+)   Abdominal (+) + obese,   Peds  Hematology   Anesthesia Other Findings Breast cancer  Reproductive/Obstetrics S/p BTL                            Anesthesia Physical Anesthesia Plan  ASA: II  Anesthesia Plan: General   Post-op Pain Management: GA combined w/ Regional for post-op pain   Induction: Intravenous  Airway Management Planned: LMA  Additional Equipment:   Intra-op Plan:   Post-operative Plan:   Informed Consent: I have reviewed the patients History and Physical, chart, labs and discussed the procedure including the risks, benefits and alternatives for the proposed anesthesia with the patient or authorized representative who has indicated his/her understanding and acceptance.   Dental advisory given  Plan Discussed with: Surgeon and CRNA  Anesthesia Plan Comments: (Plan routine monitors, GA- LMA OK, pec block for post op analgesia)        Anesthesia Quick Evaluation

## 2015-10-31 NOTE — Op Note (Signed)
Preoperative diagnosis: clinical stage I right breast cancer Postoperative diagnosis: same as above Procedure: 1. Right breast seed guided lumpectomy 2. Right axillary sn biopsy Surgeon Dr Serita Grammes Anes general with pectoral block EBL: minimal Comps none Specimen:  1. Right breast marked with paint 2. Sentinel nodes with highest count of 395 3. Additional medial and superior margin marked short superior, long lateral, double deep Sponge count correct at completion dispo to recovery stable  Indications: This is a 68 yof who has a clinical stage I right breast cancer and was seen in our multidisciplinary clinic.  Discussed proceeding with lumpectomy/sn biopsy. She had radioactive seed placed prior to beginning. I had these mm in the OR. Her genetic testing was negative prior to surgery  Procedure: After informed consent was obtained the patient was taken to the OR. She was injected with technetium in the standard periareolar fashion. She underwent a pectoral block. She was given ancef. SCDs were in place. She was prepped and draped in the standard sterile surgical fashion. A timeout was performed. I then made a periareolar incision in the right breast.  I then removed the seed with an attempt to get a clear margin.  I did confirm removal of the clip and seed with mammography.I removed some additional medial and superior tissue to clear this margin as it appeared close on several mm views. I placed clips in the cavity. I closed the deep tissue with 2-0 vicryl. The superficial tissue was closedwith 3-0 vicryl and the skin was then closed with 5-0 monocryl Glue and steristrips were applied. I made an axillary incision.  I went through the axillary fascia. I was able to locate what appeared to be several sentinel nodes with highest count listed above. There were no more palpable, blue or radioactive nodes present. I obtained hemostasis.I did place arista in the wound. I then  closed the fascia with 2-0 vicryl The skin was closed with 3-0 vicryl and 4-0 monocryl. Glue and steristrips were placed A breast binder was placed. She was extubated and transferred to recovery stable

## 2015-10-31 NOTE — H&P (Signed)
45 yof who has family history of breast cancer in her mom at age 47 presents after having tomo detected right breast mass. this area is 1.4x1.2x0.9 cm mass. this underwent core biopsy that shows a grade II IDC that is er/pr pos, her2 negative and Ki is 10%.   Other Problems Conni Slipper, RN; 10/10/2015 7:49 AM) Cholelithiasis Gastroesophageal Reflux Disease Umbilical Hernia Repair  Past Surgical History Conni Slipper, RN; 10/10/2015 7:49 AM) Gallbladder Surgery - Laparoscopic  Diagnostic Studies History Conni Slipper, RN; 10/10/2015 7:49 AM) Colonoscopy within last year Mammogram within last year Pap Smear 1-5 years ago  Medication History Conni Slipper, RN; 10/10/2015 7:49 AM) No Current Medications Medications Reconciled  Social History Conni Slipper, RN; 10/10/2015 7:49 AM) Alcohol use Occasional alcohol use. Caffeine use Carbonated beverages, Coffee, Tea. No drug use Tobacco use Never smoker.  Family History Conni Slipper, RN; 10/10/2015 7:49 AM) Breast Cancer Mother. Colon Cancer Family Members In General. Colon Polyps Sister. Heart disease in female family member before age 53 Hypertension Father. Respiratory Condition Father.  Pregnancy / Birth History Conni Slipper, RN; 10/10/2015 7:49 AM) Age at menarche 39 years. Age of menopause <43 Contraceptive History Oral contraceptives. Gravida 3 Maternal age 4-20 Para 3  Review of Systems Conni Slipper RN; 10/10/2015 7:49 AM) General Not Present- Appetite Loss, Chills, Fatigue, Fever, Night Sweats, Weight Gain and Weight Loss. Skin Not Present- Change in Wart/Mole, Dryness, Hives, Jaundice, New Lesions, Non-Healing Wounds, Rash and Ulcer. HEENT Present- Hearing Loss and Wears glasses/contact lenses. Not Present- Earache, Hoarseness, Nose Bleed, Oral Ulcers, Ringing in the Ears, Seasonal Allergies, Sinus Pain, Sore Throat, Visual Disturbances and Yellow Eyes. Respiratory Present- Snoring. Not Present- Bloody sputum,  Chronic Cough, Difficulty Breathing and Wheezing. Breast Present- Breast Mass. Not Present- Breast Pain, Nipple Discharge and Skin Changes. Cardiovascular Not Present- Chest Pain, Difficulty Breathing Lying Down, Leg Cramps, Palpitations, Rapid Heart Rate, Shortness of Breath and Swelling of Extremities. Gastrointestinal Not Present- Abdominal Pain, Bloating, Bloody Stool, Change in Bowel Habits, Chronic diarrhea, Constipation, Difficulty Swallowing, Excessive gas, Gets full quickly at meals, Hemorrhoids, Indigestion, Nausea, Rectal Pain and Vomiting. Female Genitourinary Not Present- Frequency, Nocturia, Painful Urination, Pelvic Pain and Urgency. Musculoskeletal Not Present- Back Pain, Joint Pain, Joint Stiffness, Muscle Pain, Muscle Weakness and Swelling of Extremities. Neurological Not Present- Decreased Memory, Fainting, Headaches, Numbness, Seizures, Tingling, Tremor, Trouble walking and Weakness. Psychiatric Not Present- Anxiety, Bipolar, Change in Sleep Pattern, Depression, Fearful and Frequent crying. Endocrine Present- Hot flashes. Not Present- Cold Intolerance, Excessive Hunger, Hair Changes, Heat Intolerance and New Diabetes. Hematology Not Present- Easy Bruising, Excessive bleeding, Gland problems, HIV and Persistent Infections.   Physical Exam Rolm Bookbinder MD; 10/10/2015 10:22 PM) General Mental Status-Alert. Orientation-Oriented X3.  Chest and Lung Exam Chest and lung exam reveals -on auscultation, normal breath sounds, no adventitious sounds and normal vocal resonance.  Breast Nipples-No Discharge. Breast Lump-No Palpable Breast Mass.  Cardiovascular Cardiovascular examination reveals -normal heart sounds, regular rate and rhythm with no murmurs.  Lymphatic Head & Neck  General Head & Neck Lymphatics: Bilateral - Description - Normal. Axillary  General Axillary Region: Bilateral - Description - Normal. Note: no Pender adenopathy     Assessment &  Plan Rolm Bookbinder MD; 10/10/2015 10:26 PM) CANCER OF RIGHT FEMALE BREAST (C50.911) Story: Right breast seed guided lumpectomy, right axillary sn biopsy  We discussed the staging and pathophysiology of breast cancer. We discussed all of the different options for treatment for breast cancer including surgery, chemotherapy, radiation therapy, Herceptin, and  antiestrogen therapy. We discussed a sentinel lymph node biopsy as she does not appear to having lymph node involvement right now. We discussed the performance of that with injection of radioactive tracer. We discussed that there is a chance of having a positive node with a sentinel lymph node biopsy and we will await the permanent pathology to make any other first further decisions in terms of her treatment. One of these options might be to return to the operating room to perform an axillary lymph node dissection. We discussed up to a 5% risk lifetime of chronic shoulder pain as well as lymphedema associated with a sentinel lymph node biopsy. We discussed the options for treatment of the breast cancer which included lumpectomy versus a mastectomy. We discussed the performance of the lumpectomy with radioactive seed placement. We discussed a 5-10% chance of a positive margin requiring reexcision in the operating room. We also discussed that she will need radiation therapy (this is usually 5-7 weeks) if she undergoes lumpectomy. We discussed the mastectomy (removal of whole breast) and the postoperative care for that as well. Mastectomy can be followed by reconstruction. The decision for lumpectomy vs mastectomy has no impact on decision for chemotherapy. Most mastectomy patients will not need radiation therapy. We discussed that there is no difference in her survival whether she undergoes lumpectomy with radiation therapy or antiestrogen therapy versus a mastectomy. There is also no real difference between her recurrence either.We discussed the risks of  operation including bleeding, infection, possible reoperation. She understands her further therapy will be based on stage Genetic testing was negative for mutations

## 2015-11-01 ENCOUNTER — Encounter (HOSPITAL_BASED_OUTPATIENT_CLINIC_OR_DEPARTMENT_OTHER): Payer: Self-pay | Admitting: General Surgery

## 2015-11-05 DIAGNOSIS — Z1379 Encounter for other screening for genetic and chromosomal anomalies: Secondary | ICD-10-CM | POA: Insufficient documentation

## 2015-11-05 NOTE — Progress Notes (Signed)
GENETIC TEST RESULT  HPI: [name] was previously seen in the [facility] clinic due to a personal history of breast cancer at age 45, family history of breast, colon, and other cancers, and concerns regarding a hereditary predisposition to cancer. Please refer to our prior cancer genetics clinic note from October 11, 2015 for more information regarding [name]'s medical, social and family histories, and our assessment and recommendations, at the time. [name] recent genetic test results were disclosed to her, as were recommendations warranted by these results. These results and recommendations are discussed in more detail below.  GENETIC TEST RESULTS: At the time of [name]'s visit on 10/11/15, we recommended she pursue genetic testing of the 42-gene [employer] Common Hereditary Cancers Panel (Breast, Gyn, GI) through [facility].  The 42-gene [employer] Common Hereditary Cancers Panel (Breast, Gyn, GI) performed by [facility], [place]) includes sequencing and/or deletion/duplication analysis for the following genes: APC, ATM, AXIN2, BARD1, BMPR1A, BRCA1, BRCA2, BRIP1, CDH1, CDKN2A, CHEK2, DICER1, EPCAM, GREM1, KIT, MEN1, MLH1, MSH2, MSH6, MUTYH, NBN, NF1, PALB2, PDGFRA, PMS2, POLD1, POLE, PTEN, RAD50, RAD51C, RAD51D, SDHA, SDHB, SDHC, SDHD, SMAD4, SMARCA4, STK11, TP53, TSC1, TSC2, and VHL.  Those results are now back, the report date for which is October 23, 2015.  Genetic testing was normal, and did not reveal a deleterious mutation in these genes.  Additionally, no variants of uncertain significance (VUSes) were found. The test report will be scanned into EPIC and will be located under the Results Review tab in the Pathology>Molecular Pathology section.   We discussed with [name] that since the current genetic testing is not perfect, it is possible there may be a gene mutation in one of these genes that current testing cannot detect, but that chance is small.  We also discussed, that it is possible that another gene that has not yet been discovered, or that we have not yet tested, is responsible for the cancer diagnoses in the family, and it is, therefore, important to remain in touch with cancer genetics in the future so that we can continue to offer [name] the most up to date genetic testing.   CANCER SCREENING RECOMMENDATIONS: This result is reassuring and indicates that [name] likely does not have an increased risk for a future cancer due to a mutation in one of these genes. This normal test also suggests that [name] cancer was most likely not due to an inherited predisposition associated with one of these genes.  Most cancers happen by chance and this negative test suggests that her cancer falls into this category.  We, therefore, recommended she continue to follow the cancer management and screening guidelines provided by her oncology and primary healthcare providers.   RECOMMENDATIONS FOR FAMILY MEMBERS: Women in this family might be at some increased risk of developing cancer, over the general population risk, simply due to the family history of cancer. We recommended women in this family have a yearly mammogram beginning at age 63, or 57 years younger than the earliest onset of cancer, an an annual clinical breast exam, and perform monthly breast self-exams.  Thus, [name]'s daughters and nieces can likely begin annual mammogram screening at the age of 61 based on her diagnosis at age 16.  [name] sisters should make their doctors aware of the family history of breast cancer in their mother and sister, so that they may receive additional breast screening, such as annual breast MRI in addition to annual  mammogram, where appropriate.  Women in this family should also have a gynecological exam as recommended by their primary provider. All family members should have a colonoscopy by age 30.  FOLLOW-UP: Lastly, we discussed with [name] that  cancer genetics is a rapidly advancing field and it is possible that new genetic tests will be appropriate for her and/or her family members in the future. We encouraged her to remain in contact with cancer genetics on an annual basis so we can update her personal and family histories and let her know of advances in cancer genetics that may benefit this family.   Our contact number was provided. [name] questions were answered to her satisfaction, and she knows she is welcome to call us at anytime with additional questions or concerns.   Jeanine Luz, MS, [facility] Certified Genetic Counselor [email or web address] Phone: [phone]

## 2015-11-07 ENCOUNTER — Encounter: Payer: Self-pay | Admitting: Hematology and Oncology

## 2015-11-07 ENCOUNTER — Ambulatory Visit (HOSPITAL_BASED_OUTPATIENT_CLINIC_OR_DEPARTMENT_OTHER): Payer: Managed Care, Other (non HMO) | Admitting: Hematology and Oncology

## 2015-11-07 ENCOUNTER — Telehealth: Payer: Self-pay | Admitting: *Deleted

## 2015-11-07 VITALS — BP 124/74 | HR 77 | Temp 97.6°F | Resp 18 | Ht 64.0 in | Wt 200.6 lb

## 2015-11-07 DIAGNOSIS — Z170 Estrogen receptor positive status [ER+]: Secondary | ICD-10-CM | POA: Diagnosis not present

## 2015-11-07 DIAGNOSIS — C50411 Malignant neoplasm of upper-outer quadrant of right female breast: Secondary | ICD-10-CM | POA: Diagnosis not present

## 2015-11-07 NOTE — Progress Notes (Signed)
Patient Care Team: Cheri Fowler, MD as PCP - General (Obstetrics and Gynecology) Rolm Bookbinder, MD as Consulting Physician (General Surgery) Nicholas Lose, MD as Consulting Physician (Hematology and Oncology) Kyung Rudd, MD as Consulting Physician (Radiation Oncology) Sylvan Cheese, NP as Nurse Practitioner (Hematology and Oncology)  DIAGNOSIS: Breast cancer of upper-outer quadrant of right female breast [facility])   Staging form: Breast, AJCC 7th Edition     Clinical stage from 10/10/2015: Stage IA (T1c, N0, M0) - Unsigned       Staging comments: Staged at breast conference on 3.8.17  SUMMARY OF ONCOLOGIC HISTORY:   Breast cancer of upper-outer quadrant of right female breast ([place])   10/05/2015 Initial Diagnosis Right breast mass screening detected 10:00: 1.4 x 1.2 x 0.9 cm, ultrasound biopsy: Grade 2 invasive ductal carcinoma with DCIS ER/PR positive HER-2 negative Ki-67 10%, T1 cN0 stage IA clinical stage   10/31/2015 Surgery Right lumpectomy: Invasive ductal carcinoma, grade 2, 1.5 cm, with DCIS, margins negative, 0/4 lymph nodes negative, T1 cN0 stage IA pathological stage, ER 90%, PR 90%, HER-2 negative, Ki-67 10%    CHIEF COMPLIANT: Follow-up after lumpectomy  INTERVAL HISTORY: Dorothy Gallagher is a 45 year old with above-mentioned history right breast cancer treated with lumpectomy and is here to discuss the pathology report. She is recovered very well from surgery.  REVIEW OF SYSTEMS:   Constitutional: Denies fevers, chills or abnormal weight loss Eyes: Denies blurriness of vision Ears, nose, mouth, throat, and face: Denies mucositis or sore throat Respiratory: Denies cough, dyspnea or wheezes Cardiovascular: Denies palpitation, chest discomfort Gastrointestinal:  Denies nausea, heartburn or change in bowel habits Skin: Denies abnormal skin rashes Lymphatics: Denies new lymphadenopathy or easy bruising Neurological:Denies numbness, tingling or new  weaknesses Behavioral/Psych: Mood is stable, no new changes  Extremities: No lower extremity edema Breast: Recent right lumpectomy All other systems were reviewed with the patient and are negative.  I have reviewed the past medical history, past surgical history, social history and family history with the patient and they are unchanged from previous note.  ALLERGIES:  has No Known Allergies.  MEDICATIONS:  Current Outpatient Prescriptions  Medication Sig Dispense Refill  . HYDROcodone-acetaminophen (NORCO) 10-325 MG tablet Take 1 tablet by mouth every 6 (six) hours as needed. 15 tablet 0  . ranitidine (ZANTAC) 150 MG tablet Take 150 mg by mouth 2 (two) times daily.     No current facility-administered medications for this visit.    PHYSICAL EXAMINATION: ECOG PERFORMANCE STATUS: 1 - Symptomatic but completely ambulatory  Filed Vitals:   11/07/15 1411  BP: 124/74  Pulse: 77  Temp: 97.6 F (36.4 C)  Resp: 18   Filed Weights   11/07/15 1411  Weight: 200 lb 9.6 oz (90.992 kg)    GENERAL:alert, no distress and comfortable SKIN: skin color, texture, turgor are normal, no rashes or significant lesions EYES: normal, Conjunctiva are pink and non-injected, sclera clear OROPHARYNX:no exudate, no erythema and lips, buccal mucosa, and tongue normal  NECK: supple, thyroid normal size, non-tender, without nodularity LYMPH:  no palpable lymphadenopathy in the cervical, axillary or inguinal LUNGS: clear to auscultation and percussion with normal breathing effort HEART: regular rate & rhythm and no murmurs and no lower extremity edema ABDOMEN:abdomen soft, non-tender and normal bowel sounds MUSCULOSKELETAL:no cyanosis of digits and no clubbing  NEURO: alert & oriented x 3 with fluent speech, no focal motor/sensory deficits EXTREMITIES: No lower extremity edema  LABORATORY DATA:  I have reviewed the data as listed  Chemistry      Component Value Date/Time   NA 141 10/10/2015 1226    K 4.2 10/10/2015 1226   CO2 25 10/10/2015 1226   BUN 12.4 10/10/2015 1226   CREATININE 0.8 10/10/2015 1226      Component Value Date/Time   CALCIUM 9.6 10/10/2015 1226   ALKPHOS 68 10/10/2015 1226   AST 14 10/10/2015 1226   ALT 13 10/10/2015 1226   BILITOT <0.30 10/10/2015 1226       Lab Results  Component Value Date   WBC 7.3 10/10/2015   HGB 12.6 10/10/2015   HCT 37.2 10/10/2015   MCV 85.7 10/10/2015   PLT 304 10/10/2015   NEUTROABS 4.2 10/10/2015   ASSESSMENT & PLAN:  Breast cancer of upper-outer quadrant of right female breast ([place]) Right lumpectomy 10/31/2015: Invasive ductal carcinoma, grade 2, 1.5 cm, with DCIS, margins negative, 0/4 lymph nodes negative, T1 cN0 stage IA pathological stage, ER 90%, PR 90%, HER-2 negative, Ki-67 10%  Pathology counseling: I discussed the final pathology report of the patient provided  a copy of this report. I discussed the margins as well as lymph node surgeries. We also discussed the final staging along with previously performed ER/PR and HER-2/neu testing.  Recommendation: 1. Oncotype DX testing to determine if chemotherapy would be of any benefit followed by 2. Adjuvant radiation therapy followed by 3. Adjuvant antiestrogen therapy  Return to clinic based upon Oncotype DX score. We will call the patient with the result.   No orders of the defined types were placed in this encounter.   The patient has a good understanding of the overall plan. she agrees with it. she will call with any problems that may develop before the next visit here.   Rulon Eisenmenger, MD 11/07/2015

## 2015-11-07 NOTE — Assessment & Plan Note (Signed)
Right lumpectomy 10/31/2015: Invasive ductal carcinoma, grade 2, 1.5 cm, with DCIS, margins negative, 0/4 lymph nodes negative, T1 cN0 stage IA pathological stage, ER 90%, PR 90%, HER-2 negative, Ki-67 10%  Pathology counseling: I discussed the final pathology report of the patient provided  a copy of this report. I discussed the margins as well as lymph node surgeries. We also discussed the final staging along with previously performed ER/PR and HER-2/neu testing.  Recommendation: 1. Oncotype DX testing to determine if chemotherapy would be of any benefit followed by 2. Adjuvant radiation therapy followed by 3. Adjuvant antiestrogen therapy  Return to clinic based upon Oncotype DX score. We will call the patient with the result.

## 2015-11-07 NOTE — Progress Notes (Signed)
Unable to get in to exam room prior to MD.  No assessment performed.  

## 2015-11-07 NOTE — Telephone Encounter (Signed)
Received order per Dr. Lindi Adie for onoctype testing. Requisition sent to pathology.

## 2015-11-14 ENCOUNTER — Encounter: Payer: Self-pay | Admitting: Hematology and Oncology

## 2015-11-26 ENCOUNTER — Telehealth: Payer: Self-pay | Admitting: *Deleted

## 2015-11-26 ENCOUNTER — Encounter (HOSPITAL_COMMUNITY): Payer: Self-pay

## 2015-11-26 NOTE — Telephone Encounter (Signed)
Received oncotype score of 13.  Informed pt of results and discuss appt with Dr. Lisbeth Renshaw and next step of xrt. Referral placed for Dr. Lisbeth Renshaw. Physician team notified.

## 2015-11-27 ENCOUNTER — Encounter: Payer: Self-pay | Admitting: Radiation Oncology

## 2015-11-27 NOTE — Progress Notes (Addendum)
Location of Breast Cancer: Right Breast Upper Outer Quadrant  Histology per Pathology Report: Diagnosis 10/04/2015:Breast, right, needle core biopsy, 10:00 o'clock - INVASIVE AND IN SITU DUCTAL CARCINOMA  Receptor Status: ER(90%+), PR (90%+), Her2-neu (neg), Ki-67=10%)  Did patient present with symptoms (if so, please note symptoms) or was this found on screening mammography?: Routine mammogram  Then U/S 10/02/15  Past/Anticipated interventions by surgeon, if any : Diagnosis 10/31/15 :Dr. Darleen Crocker 1. Breast, lumpectomy, Right Breast Tissue, seed is at E6, clip at [place], GRADE 2, SPINNING 1.5 CM DUCTAL CARCINOMA IN SITU WITH ASSOCIATED CALCIFICATION ALL MARGINS OF RESECTION ARE NEGATIVE FOR TUMOR 2. Breast, excision, Additional Superior Margin, Short suture superior, long is lateral, double is deep BENIGN BREAST TISSUE NEGATIVE FOR MALIGNANCY 3. Breast, excision, Additional Medial Margin, short suture superior, long is lateral, double is deep FIBROCYSTIC CHANGES AND USUAL DUCTAL HYPERPLASIANEGATIVE FOR MALIGNANCY 4. Lymph node, sentinel, biopsy, Right Axillary 1 of 4 FINAL  Diagnosis(continued) ONE BENIGN LYMPH NODE (0/1) 5. Lymph node, sentinel, biopsy, SLN ONE BENIGN LYMPH NODE (0/1) 6. Lymph node, sentinel, biopsy, SLN ONE BENIGN LYMPH NODE (0/1) 7. Lymph node, sentinel, biopsy, SLN ONE BENIGN LYMPH NODE (0/1)    Follow  Up done with Dr. Donne Hazel 11/22/15, incision under axilla and on top of right nipple well healed,   Past/Anticipated interventions by medical oncology, if any: Chemotherapy : Dr. Lindi Adie, OncotypeDX =13 seen 11/07/15  Lymphedema issues, if any:   NO  Pain issues, if any:  NO  SAFETY ISSUES:  Prior radiation? NO  Pacemaker/ICD? NO  Possible current pregnancy? NO  Is the patient on methotrexate? NO  Current Complaints / other details: Seen Breast Clinic 10/10/15 ,Married,  Mother Breast  Ca, Maternal grandmother Colon ca,  Father Lung ca    Rebecca Eaton, RN 11/27/2015,8:21 AM  Allergies:NKA  BP 132/80 mmHg  Pulse 71  Temp(Src) 98.3 F (36.8 C) (Oral)  Resp 20  Ht 5' 4"  (1.626 m)  Wt 201 lb 12.8 oz (91.536 kg)  BMI 34.62 kg/m2  SpO2 100%  Wt Readings from Last 3 Encounters:  11/28/15 201 lb 12.8 oz (91.536 kg)  11/07/15 200 lb 9.6 oz (90.992 kg)  10/31/15 203 lb (92.08 kg)

## 2015-11-28 ENCOUNTER — Ambulatory Visit
Admission: RE | Admit: 2015-11-28 | Discharge: 2015-11-28 | Disposition: A | Discharge status: Home / Self Care | Payer: Managed Care, Other (non HMO) | Source: Ambulatory Visit | Attending: Radiation Oncology | Admitting: Radiation Oncology

## 2015-11-28 ENCOUNTER — Encounter: Payer: Self-pay | Admitting: Radiation Oncology

## 2015-11-28 VITALS — BP 132/80 | HR 71 | Temp 98.3°F | Resp 20 | Ht 64.0 in | Wt 201.8 lb

## 2015-11-28 DIAGNOSIS — Z510 Encounter for antineoplastic radiation therapy: Secondary | ICD-10-CM | POA: Diagnosis not present

## 2015-11-28 DIAGNOSIS — Z170 Estrogen receptor positive status [ER+]: Secondary | ICD-10-CM | POA: Insufficient documentation

## 2015-11-28 DIAGNOSIS — C50411 Malignant neoplasm of upper-outer quadrant of right female breast: Secondary | ICD-10-CM | POA: Insufficient documentation

## 2015-11-28 DIAGNOSIS — Z803 Family history of malignant neoplasm of breast: Secondary | ICD-10-CM | POA: Diagnosis not present

## 2015-11-28 NOTE — Progress Notes (Signed)
Please see the Nurse Progress Note in the MD Initial Consult Encounter for this patient. 

## 2015-11-29 NOTE — Progress Notes (Signed)
Radiation Oncology         (336) [phone] ________________________________  Name: Dorothy Gallagher MRN: [id number]  Date: 11/28/2015  DOB: 1970-09-26  WP:[email or web address],NKNL D, MD  Rolm Bookbinder, MD     REFERRING PHYSICIAN: Rolm Bookbinder, MD   DIAGNOSIS: Stage IA (T1cN0)  ER/PR positive, HER2 negative, invasive ductal carcinoma of the right breast.   HISTORY OF PRESENT ILLNESS::Dorothy Gallagher is a 45 y.o. female with a newly diagnosed breast cancer who presented for a screening mammogram earlier this year which revealed right breast that showed calcifications which led to diagnostic imaging. She had an ultrasound of the breast 10/02/2015, revealing a hypoechoic mass in the 10 o'clock position, 3 cm from the nipple, with normal-appearing lymph nodes. She had an ultrasound guided biopsy 10/04/2015, revealing grade II invasive and in situ ductal carcinoma of the right breast, ER (90%), PR (90%), Ki 67 (10%), and Her2-neu negative.   The patient returns today for routine follow-up.  Lumpectomy of the right breast on 10/31/15 revealed grade 2 invasive ductal carcinoma spanning 1.5 cm, DCIS with associated calcification, and all margins were negative (pT1c, pN0) (ER/PR positive, HER2 negative). All four right axillary sentinel lymph nodes biopsied were negative. Oncotype Dx score was 13; low risk. She will not receive chemotherapy. The patient and her husband present to the clinic to discuss the role of radiotherapy for the management of her disease.  PREVIOUS RADIATION THERAPY: No   PAST MEDICAL HISTORY:  Past Medical History  Diagnosis Date  . Breast cancer of upper-outer quadrant of right female breast ([place]) 10/04/2015  . Breast cancer ([place])   . Hearing loss     right ear  . Snores   . GERD (gastroesophageal reflux disease)   . H/O colonoscopy 2016      PAST SURGICAL HISTORY: Past Surgical History  Procedure Laterality Date  . Cholecystectomy    . Wisdom tooth extraction    . Essure  tubal ligation    . Radioactive seed guided mastectomy with axillary sentinel lymph node biopsy Right 10/31/2015    Procedure: RADIOACTIVE SEED GUIDED RIGHT PARTIAL MASTECTOMY WITH RIGHT AXILLARY SENTINEL LYMPH NODE BIOPSY;  Surgeon: Rolm Bookbinder, MD;  Location: [facility];  Service: General;  Laterality: Right;    FAMILY HISTORY:  Family History  Problem Relation Age of Onset  . Breast cancer Mother 47    w/ metastasis  . Other Mother     hx of partial hysterectomy; hx of ovarian cysts  . Lung cancer Father 64    former smoker  . Colon cancer Maternal Grandmother     dx. 98s  . Fibrocystic breast disease Sister   . Colon polyps Sister     approx 1-3  . Colon cancer Maternal Uncle   . Colon polyps Sister     1-3  . Fibrocystic breast disease Sister     cysts "enlarge and go down"  . Cancer Maternal Aunt     NOS; d. 13s  . Cancer Maternal Uncle     blood, liver, or pancreatic cancer; dx. late 60s-early 70s  . Lung cancer Maternal Uncle     smoker; d. late 45s  . Leukemia Cousin     maternal 1st cousin dx. late 39s  . Breast cancer Cousin     s/p lumpectomy; dx. late 67s or earlier  . Thyroid cancer Cousin 41    maternal 1st cousin; s/p thyroidectomy  . Other Paternal Grandmother     hx of "benign  40-lb stomach tumor" that was removed  . Heart attack Paternal Grandfather 39  . Cancer Cousin 21    "knot on leg" that metastasized to bone cancer     SOCIAL HISTORY:  reports that she has never smoked. She has never used smokeless tobacco. She reports that she drinks alcohol. She reports that she does not use illicit drugs. 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 She has three children and is married.   ALLERGIES: Review of patient's allergies indicates no known allergies.   MEDICATIONS:  Current Outpatient Prescriptions  Medication Sig Dispense Refill  . ranitidine (ZANTAC) 150 MG tablet Take 150 mg by mouth 2 (two) times daily.      No current facility-administered medications for this encounter.     REVIEW OF SYSTEMS: On review of systems, the patient reports that she is doing very well. She denies any swelling in her breast or upper extremity. She denies any concerns with her incision site. She denies any chest pain, shortness of breath, fevers, chills, unintended weight changes. She has not had any changes in appetite, abdominal pain, nausea, vomiting, dysfunction of her bowel or bladder activity. A complete review of systems is obtained and is otherwise negative.    PHYSICAL EXAM:  BP 132/80 mmHg  Pulse 71  Temp(Src) 98.3 F (36.8 C) (Oral)  Resp 20  Ht '5\' 4"'$  (1.626 m)  Wt 201 lb 12.8 oz (91.536 kg)  BMI 34.62 kg/m2  SpO2 100% Pain scale 0/10 In general this is a well appearing Caucasian female in no acute distress. She's alert and oriented x4 and appropriate throughout the examination. Cardiopulmonary assessment is negative for acute distress and she exhibits normal effort. Her right breast incision is intact as well as her axillary incision.    ECOG =0  0 - Asymptomatic (Fully active, able to carry on all predisease activities without restriction)  1 - Symptomatic but completely ambulatory (Restricted in physically strenuous activity but ambulatory and able to carry out work of a light or sedentary nature. For example, light housework, office work)  2 - Symptomatic, <50% in bed during the day (Ambulatory and capable of all self care but unable to carry out any work activities. Up and about more than 50% of waking hours)  3 - Symptomatic, >50% in bed, but not bedbound (Capable of only limited self-care, confined to bed or chair 50% or more of waking hours)  4 - Bedbound (Completely disabled. Cannot carry on any self-care. Totally confined to bed or chair)  5 - Death   Eustace Pen MM, Creech RH, Tormey DC, et al. [phone]). "Toxicity and response criteria of the [facility]". [place]  Oncol. 5 (6): 649-55   LABORATORY DATA:  Lab Results  Component Value Date   WBC 7.3 10/10/2015   HGB 12.6 10/10/2015   HCT 37.2 10/10/2015   MCV 85.7 10/10/2015   PLT 304 10/10/2015   Lab Results  Component Value Date   NA 141 10/10/2015   K 4.2 10/10/2015   CO2 25 10/10/2015   Lab Results  Component Value Date   ALT 13 10/10/2015   AST 14 10/10/2015   ALKPHOS 68 10/10/2015   BILITOT <0.30 10/10/2015      RADIOGRAPHY: Nm Sentinel Node Inj-no Rpt (breast)  10/31/2015  CLINICAL DATA: right axillary sn biopsy Sulfur colloid was injected intradermally by the nuclear medicine technologist for breast cancer sentinel node localization.   Mm Breast Surgical Specimen  10/31/2015  CLINICAL DATA:  Status post right breast excision following radioactive seed localization. EXAM: SPECIMEN RADIOGRAPH OF THE RIGHT BREAST COMPARISON:  Previous exam(s). FINDINGS: Status post excision of the right breast. The radioactive seed and biopsy marker clip are present, completely intact, and were marked for pathology. IMPRESSION: Specimen radiograph of the right breast. Electronically Signed   By: Lajean Manes M.D.   On: 10/31/2015 09:10    IMPRESSION/PLAN: 1. Stage IA (T1cN0)  ER/PR positive, HER2 negative, invasive ductal carcinoma of the right breast. We reviewed the role of radiotherapy and rationale for its use. We discussed the risks, benefits, short and long term effects of the treatment. She is interested in moving forward and written consent is obtained to move forward. She will be scheduled for simulation next Monday. At the end of the conversation, all questions were answered to her satisfaction.   In a visit lasting 25 minutes, greater than 50% of the time was spend discussing treatment expectations and delivery.   The above documentation reflects my direct findings during this shared patient visit. Please see the separate note by Dr. Lisbeth Renshaw on this date for the remainder of the patient's  plan of care.    Carola Rhine, PAC

## 2015-12-03 ENCOUNTER — Other Ambulatory Visit: Payer: Self-pay | Admitting: *Deleted

## 2015-12-03 ENCOUNTER — Telehealth: Payer: Self-pay | Admitting: Hematology and Oncology

## 2015-12-03 ENCOUNTER — Ambulatory Visit
Admission: RE | Admit: 2015-12-03 | Discharge: 2015-12-03 | Disposition: A | Discharge status: Home / Self Care | Payer: Managed Care, Other (non HMO) | Source: Ambulatory Visit | Attending: Radiation Oncology | Admitting: Radiation Oncology

## 2015-12-03 DIAGNOSIS — C50411 Malignant neoplasm of upper-outer quadrant of right female breast: Secondary | ICD-10-CM

## 2015-12-03 DIAGNOSIS — Z510 Encounter for antineoplastic radiation therapy: Secondary | ICD-10-CM | POA: Diagnosis not present

## 2015-12-03 NOTE — Telephone Encounter (Signed)
s,w, pt and advised on July appt....pt could not come 6.30 due to being out of town...pt gv 1st date that she would be available....pt ok and aware of d.t

## 2015-12-04 ENCOUNTER — Ambulatory Visit: Payer: Managed Care, Other (non HMO) | Attending: General Surgery | Admitting: Physical Therapy

## 2015-12-04 ENCOUNTER — Encounter: Payer: Self-pay | Admitting: Physical Therapy

## 2015-12-04 DIAGNOSIS — M6281 Muscle weakness (generalized): Secondary | ICD-10-CM | POA: Diagnosis present

## 2015-12-04 DIAGNOSIS — M25511 Pain in right shoulder: Secondary | ICD-10-CM | POA: Diagnosis present

## 2015-12-04 NOTE — Therapy (Signed)
[place], [place], [address] Phone: [phone]   Fax:  [phone]  Physical Therapy Evaluation  Patient Details  Name: Dorothy Gallagher MRN: [id number] Date of Birth: April 02, 1971 Referring Provider: Dr. Rolm Bookbinder  Encounter Date: 12/04/2015      PT End of Session - 12/04/15 1232    Visit Number 1   Number of Visits 9   Date for PT Re-Evaluation 01/08/16   PT Start Time 0932   PT Stop Time 1017   PT Time Calculation (min) 45 min   Activity Tolerance Patient tolerated treatment well   Behavior During Therapy [facility] for tasks assessed/performed      Past Medical History  Diagnosis Date  . Breast cancer of upper-outer quadrant of right female breast ([place]) 10/04/2015  . Breast cancer ([place])   . Hearing loss     right ear  . Snores   . GERD (gastroesophageal reflux disease)   . H/O colonoscopy 2016    Past Surgical History  Procedure Laterality Date  . Cholecystectomy    . Wisdom tooth extraction    . Essure tubal ligation    . Radioactive seed guided mastectomy with axillary sentinel lymph node biopsy Right 10/31/2015    Procedure: RADIOACTIVE SEED GUIDED RIGHT PARTIAL MASTECTOMY WITH RIGHT AXILLARY SENTINEL LYMPH NODE BIOPSY;  Surgeon: Rolm Bookbinder, MD;  Location: [facility];  Service: General;  Laterality: Right;    There were no vitals filed for this visit.       Subjective Assessment - 12/04/15 0947    Subjective Pt has pain in her right shoulder that can be pinpointed that she noticed right after surgery. She does report some pain while lifting her right arm while performing work duties. She has some tenderness across superior right chest.   Pertinent History Patient was diagnosed on 09/21/15 with right grade 2 invasive ductal carcinoma with DCIS.  It is ER/PR positive, HER2 negative and has a Ki67 of 10%.  Her mass measures 1.4 cm and is located in the right upper  outer quadrant. Pt underwent lumpectomy with SLNB on 10/31/15. She will begin radiation soon.    Patient Stated Goals to be pain free    Currently in Pain? Yes   Pain Score 7    Pain Location Arm   Pain Orientation Right;Upper   Pain Descriptors / Indicators Stabbing  pt states it feels like rubbing a bruise when the spot is touched   Pain Type Acute pain   Pain Onset More than a month ago   Pain Frequency Constant  when touched   Aggravating Factors  sleeping on it   Pain Relieving Factors nothing            [facility] PT Assessment - 12/04/15 0001    Assessment   Medical Diagnosis Right breast cancer   Referring Provider Dr. Rolm Bookbinder   Onset Date/Surgical Date 10/31/15   Hand Dominance Right   Prior Therapy none   Precautions   Precautions Other (comment)  at risk for lymphedema   Restrictions   Weight Bearing Restrictions No   Balance Screen   Has the patient fallen in the past 6 months No   Has the patient had a decrease in activity level because of a fear of falling?  No   Is the patient reluctant to leave their home because of a fear of falling?  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   Leisure She does not exercise, pt has been moving and has been very physical    Cognition   Overall Cognitive Status Within Functional Limits for tasks assessed   AROM   Right Shoulder Flexion 162 Degrees   Right Shoulder ABduction 180 Degrees   Right Shoulder Internal Rotation 72 Degrees   Right Shoulder External Rotation 90 Degrees   Strength   Right Shoulder Flexion  5/5   Right Shoulder ABduction 4/5   Right Shoulder Internal Rotation 5/5   Right Shoulder External Rotation 5/5   Left Shoulder Flexion 4/5   Left Shoulder ABduction 4/5   Left Shoulder Internal Rotation 4/5   Left Shoulder External Rotation 4/5   Palpation   Palpation comment tightness across right pec major           LYMPHEDEMA/ONCOLOGY QUESTIONNAIRE - 12/04/15 0955    Type   Cancer Type Right breast cancer   Surgeries   Lumpectomy Date 10/31/15   Sentinel Lymph Node Biopsy Date 10/31/15   Number Lymph Nodes Removed 4   Treatment   Active Chemotherapy Treatment No   Past Chemotherapy Treatment No   Active Radiation Treatment No  will begin next week    Past Radiation Treatment No   Current Hormone Treatment No  will begin Tamoxifen after radiation   Past Hormone Therapy No   What other symptoms do you have   Are you Having Heaviness or Tightness No   Are you having Pain Yes   Are you having pitting edema No   Is it Hard or Difficult finding clothes that fit No   Do you have infections No   Is there Decreased scar mobility No   Right Upper Extremity Lymphedema   10 cm Proximal to Olecranon Process 32.8 cm   Olecranon Process 28 cm   10 cm Proximal to Ulnar Styloid Process 24.8 cm   Just Proximal to Ulnar Styloid Process 16 cm   Across Hand at [employer] 18.2 cm   At [place] of 2nd Digit 6.2 cm           Quick Dash - 12/04/15 0001    Open a tight or new jar No difficulty   Do heavy household chores (wash walls, wash floors) No difficulty   Carry a shopping bag or briefcase No difficulty   Wash your back No difficulty   Use a knife to cut food No difficulty   Recreational activities in which you take some force or impact through your arm, shoulder, or hand (golf, hammering, tennis) No difficulty   During the past week, to what extent has your arm, shoulder or hand problem interfered with your normal social activities with family, friends, neighbors, or  groups? Not at all   During the past week, to what extent has your arm, shoulder or hand problem limited your work or other regular daily activities Not at all   Arm, shoulder, or hand pain. None   Tingling (pins and needles) in your arm, shoulder, or hand None   Difficulty Sleeping No difficulty   DASH  Score 0 %                           Breast Clinic Goals - 10/10/15 1618    Patient will be able to verbalize understanding of pertinent lymphedema risk reduction practices relevant to her diagnosis specifically related to skin care.   Time 1   Period Days   Status Achieved   Patient will be able to return demonstrate and/or verbalize understanding of the post-op home exercise program related to regaining shoulder range of motion.   Time 1   Period Days   Status Achieved   Patient will be able to verbalize understanding of the importance of attending the postoperative After Breast Cancer Class for further lymphedema risk reduction education and therapeutic exercise.   Time 1   Period Days   Status Achieved          Long Term Clinic Goals - 12/04/15 1239    CC Long Term Goal  #1   Title Pt to report decreased pain in right shoulder upon palpation of 2/10 to allow increase comfort   Baseline 7/10   Time 4   Period Weeks   Status New   CC Long Term Goal  #2   Title Pt to be independent in a home exercise program for scapular stabilization to help decrease right shoulder pain   Time 4   Period Weeks   Status New   CC Long Term Goal  #3   Title Pt will be able to verbalize lymphedema risk reduction practices   Time 4   Period Weeks   Status New            Plan - 12/04/15 1233    Clinical Impression Statement Pt underwent a lumpectomy and sentinel lymph node biopsy on 10/31/15. Pt has full range of motion bilaterally and good strength throughout bilaterally. She is having pain in her right shoulder that can be pinpointed and also has discomfort in her  right chest. She states it feels like touching a bruise. She seems to have more chest discomfort from R shoulder IR most likely due to tight pec major. She reports a grabbing pain at times prior to surgery in both shoulders. Pt would benefit from skilled PT services to help decrease pain in right shoulder and right chest.    Rehab Potential Excellent   Clinical Impairments Affecting Rehab Potential pt to begin radiation soon   PT Frequency 2x / week   PT Duration 4 weeks   PT Treatment/Interventions Manual techniques;Taping;Therapeutic exercise;Iontophoresis 58m/ml Dexamethasone;Therapeutic activities;ADLs/Self Care Home Management   PT Next Visit Plan begin soft tissue mobilization to right chest, give scapular stabilization exercises to help decrease shoulder pain, try to pinpoint cause of pain   Consulted and Agree with Plan of Care Patient      Patient will benefit from skilled therapeutic intervention in order to improve the following deficits and impairments:  Decreased strength, Decreased knowledge of precautions, Pain, Impaired UE functional use, Increased fascial restricitons  Visit Diagnosis: Pain in right shoulder - Plan: PT plan of care cert/re-cert  Muscle weakness (generalized) - Plan: PT plan of care cert/re-cert     Problem List Patient Active Problem List   Diagnosis Date Noted  . Genetic testing 11/05/2015  . Family history of breast cancer in mother 10/11/2015  . Family history of colon cancer 10/11/2015  . History of colonic polyps 10/11/2015  . Breast cancer of upper-outer quadrant  of right female breast ([place]) 10/05/2015    Alexia Freestone 12/04/2015, 12:43 PM  [place], [place], [address] Phone: [phone]   Fax:  [phone]  Name: KAYDRA BORGEN MRN: [id number] Date of Birth: May 24, 1971   Allyson Sabal, PT 12/04/2015 12:43 PM

## 2015-12-06 ENCOUNTER — Ambulatory Visit: Payer: Managed Care, Other (non HMO) | Admitting: Hematology and Oncology

## 2015-12-07 DIAGNOSIS — Z510 Encounter for antineoplastic radiation therapy: Secondary | ICD-10-CM | POA: Diagnosis not present

## 2015-12-10 ENCOUNTER — Ambulatory Visit
Admission: RE | Admit: 2015-12-10 | Discharge: 2015-12-10 | Disposition: A | Discharge status: Home / Self Care | Payer: Managed Care, Other (non HMO) | Source: Ambulatory Visit | Attending: Radiation Oncology | Admitting: Radiation Oncology

## 2015-12-10 DIAGNOSIS — Z510 Encounter for antineoplastic radiation therapy: Secondary | ICD-10-CM | POA: Diagnosis not present

## 2015-12-10 NOTE — Progress Notes (Signed)
  Radiation Oncology         [phone] ________________________________  Name: CATHRIN ERMEL MRN: [id number]  Date: 12/03/2015  DOB: 10-09-1970  DIAGNOSIS:     ICD-9-CM ICD-10-CM   1. Breast cancer of upper-outer quadrant of right female breast ([place]) 174.4 C50.411      SIMULATION AND TREATMENT PLANNING NOTE  The patient presented for simulation prior to beginning her course of radiation treatment for her diagnosis of right-sided breast cancer. The patient was placed in a supine position on a breast board. A customized vac-lock bag was constructed and this complex treatment device will be used on a daily basis during her treatment. In this fashion, a CT scan was obtained through the chest area and an isocenter was placed near the chest wall within the breast.  The patient will be planned to receive a course of radiation initially to a dose of 50.4 Gy. This will consist of a whole breast radiotherapy technique. To accomplish this, 2 customized blocks have been designed which will correspond to medial and lateral whole breast tangent fields. This treatment will be accomplished at 1.8 Gy per fraction. A forward planning technique will also be evaluated to determine if this approach improves the plan. It is anticipated that the patient will then receive a 10 Gy boost to the seroma cavity which has been contoured. This will be accomplished at 2 Gy per fraction.   This initial treatment will consist of a 3-D conformal technique. The seroma has been contoured as the primary target structure. Additionally, dose volume histograms of both this target as well as the lungs and heart will also be evaluated. Such an approach is necessary to ensure that the target area is adequately covered while the nearby critical  normal structures are adequately spared.  Plan:  The final anticipated total dose therefore will correspond to 60.4 Gy.    _______________________________   Jodelle Gross, MD, PhD

## 2015-12-11 ENCOUNTER — Ambulatory Visit
Admission: RE | Admit: 2015-12-11 | Discharge: 2015-12-11 | Disposition: A | Discharge status: Home / Self Care | Payer: Managed Care, Other (non HMO) | Source: Ambulatory Visit | Attending: Radiation Oncology | Admitting: Radiation Oncology

## 2015-12-11 ENCOUNTER — Ambulatory Visit: Payer: Managed Care, Other (non HMO)

## 2015-12-11 DIAGNOSIS — Z510 Encounter for antineoplastic radiation therapy: Secondary | ICD-10-CM | POA: Diagnosis not present

## 2015-12-11 DIAGNOSIS — C50411 Malignant neoplasm of upper-outer quadrant of right female breast: Secondary | ICD-10-CM | POA: Diagnosis not present

## 2015-12-11 MED ORDER — ALRA NON-METALLIC DEODORANT (RAD-ONC)
1.0000 "application " | Freq: Once | TOPICAL | Status: AC
  Administered 2015-12-11: 1 via TOPICAL

## 2015-12-11 MED ORDER — RADIAPLEXRX EX GEL
Freq: Once | CUTANEOUS | Status: AC
  Administered 2015-12-11: 11:00:00 via TOPICAL

## 2015-12-11 NOTE — Progress Notes (Signed)
Patient education done, gave Radiation therapy and you book, my business card, alra and radiaplex gel to patient,discussed ways to manage side effects,fatigue,skin irritation, swelling,soreness, use of electric razor needed for shaving, increase protein in diet, stay well hydrated, drink plenty water/fluids, sees MD weekly/prn,verbal understanding, teach back given 10:42 AM

## 2015-12-12 ENCOUNTER — Ambulatory Visit
Admission: RE | Admit: 2015-12-12 | Discharge: 2015-12-12 | Disposition: A | Discharge status: Home / Self Care | Payer: Managed Care, Other (non HMO) | Source: Ambulatory Visit | Attending: Radiation Oncology | Admitting: Radiation Oncology

## 2015-12-12 DIAGNOSIS — Z510 Encounter for antineoplastic radiation therapy: Secondary | ICD-10-CM | POA: Diagnosis not present

## 2015-12-13 ENCOUNTER — Ambulatory Visit
Admission: RE | Admit: 2015-12-13 | Discharge: 2015-12-13 | Disposition: A | Discharge status: Home / Self Care | Payer: Managed Care, Other (non HMO) | Source: Ambulatory Visit | Attending: Radiation Oncology | Admitting: Radiation Oncology

## 2015-12-13 DIAGNOSIS — Z510 Encounter for antineoplastic radiation therapy: Secondary | ICD-10-CM | POA: Diagnosis not present

## 2015-12-14 ENCOUNTER — Ambulatory Visit: Payer: Managed Care, Other (non HMO) | Admitting: Physical Therapy

## 2015-12-14 ENCOUNTER — Ambulatory Visit
Admission: RE | Admit: 2015-12-14 | Discharge: 2015-12-14 | Disposition: A | Discharge status: Home / Self Care | Payer: Managed Care, Other (non HMO) | Source: Ambulatory Visit | Attending: Radiation Oncology | Admitting: Radiation Oncology

## 2015-12-14 ENCOUNTER — Encounter: Payer: Self-pay | Admitting: Radiation Oncology

## 2015-12-14 VITALS — BP 151/93 | HR 73 | Temp 98.3°F | Ht 64.0 in | Wt 202.3 lb

## 2015-12-14 DIAGNOSIS — M25511 Pain in right shoulder: Secondary | ICD-10-CM | POA: Diagnosis not present

## 2015-12-14 DIAGNOSIS — C50411 Malignant neoplasm of upper-outer quadrant of right female breast: Secondary | ICD-10-CM

## 2015-12-14 DIAGNOSIS — Z510 Encounter for antineoplastic radiation therapy: Secondary | ICD-10-CM | POA: Diagnosis not present

## 2015-12-14 NOTE — Patient Instructions (Signed)
Supine With Rotation    Lie on back with one knee drawn toward chest. Have your arms positioned straight out to your sides.  Slowly bring bent leg across body until stretch is felt in lower back area. Hold _30__ seconds. Repeat to other side. Repeat _1-2__ times per session. Do _1-2__ sessions per day.  Copyright  VHI. All rights reserved.

## 2015-12-14 NOTE — Therapy (Signed)
[place], [place], [address] Phone: [phone]   Fax:  [phone]  Physical Therapy Treatment  Patient Details  Name: Dorothy Gallagher MRN: [id number] Date of Birth: Jul 21, 1971 Referring Provider: Dr. Rolm Bookbinder  Encounter Date: 12/14/2015      PT End of Session - 12/14/15 1234    Visit Number 2   Number of Visits 9   Date for PT Re-Evaluation 01/08/16   PT Start Time 0935   PT Stop Time 1019   PT Time Calculation (min) 44 min   Activity Tolerance Patient tolerated treatment well   Behavior During Therapy [facility] for tasks assessed/performed      Past Medical History  Diagnosis Date  . Breast cancer of upper-outer quadrant of right female breast ([place]) 10/04/2015  . Breast cancer ([place])   . Hearing loss     right ear  . Snores   . GERD (gastroesophageal reflux disease)   . H/O colonoscopy 2016    Past Surgical History  Procedure Laterality Date  . Cholecystectomy    . Wisdom tooth extraction    . Essure tubal ligation    . Radioactive seed guided mastectomy with axillary sentinel lymph node biopsy Right 10/31/2015    Procedure: RADIOACTIVE SEED GUIDED RIGHT PARTIAL MASTECTOMY WITH RIGHT AXILLARY SENTINEL LYMPH NODE BIOPSY;  Surgeon: Rolm Bookbinder, MD;  Location: [place];  Service: General;  Laterality: Right;    There were no vitals filed for this visit.      Subjective Assessment - 12/14/15 0937    Subjective Nothing new.  Things are going okay.  Has had four days of radiation.   Currently in Pain? Yes            [facility] PT Assessment - 12/14/15 0001    Palpation   Palpation comment tender point is just at anterior aspect of subacromial space   Special Tests    Special Tests Rotator Cuff Impingement   Rotator Cuff Impingment tests --  negative painful arc, flexion/IR impingement, resisted IR/fl                     [facility] Adult PT  Treatment/Exercise - 12/14/15 0001    Exercises   Exercises Shoulder   Shoulder Exercises: Supine   Other Supine Exercises hooklying with arms outstretched, lower trunk rotation for right chest stretch; instructed in this and patient performed it, also instructed in how to progress by changing leg positions   Other Supine Exercises tried supine over towel roll land supine over foam roll, but patient got minimal stretch from either arms outstretched to the sides or in a "Y" position, so didn't continue   Shoulder Exercises: Sidelying   Other Sidelying Exercises on left side, D2 with right arm to the point of stretch at right chest x 10   Manual Therapy   Manual Therapy Soft tissue mobilization;Myofascial release;Scapular mobilization;Passive ROM;Neural Stretch   Myofascial Release at right chest in diagonal and horizontal positions, using right upper arm to pull at that end   Scapular Mobilization in left sidelying to right shoulder, especially for protraction and depression with stretch of right upper trap   Passive ROM To right pect in supine with horizontal abduction stretch;   Neural Stretch Right UE, passive                PT Education - 12/14/15 1234    Education provided Yes   Education Details right chest  stretch in hooklying with lower trunk rotation   Person(s) Educated Patient   Methods Explanation;Tactile cues;Verbal cues;Handout   Comprehension Verbalized understanding;Returned demonstration              Breast Clinic Goals - 10/10/15 1618    Patient will be able to verbalize understanding of pertinent lymphedema risk reduction practices relevant to her diagnosis specifically related to skin care.   Time 1   Period Days   Status Achieved   Patient will be able to return demonstrate and/or verbalize understanding of the post-op home exercise program related to regaining shoulder range of motion.   Time 1   Period Days   Status Achieved   Patient will be  able to verbalize understanding of the importance of attending the postoperative After Breast Cancer Class for further lymphedema risk reduction education and therapeutic exercise.   Time 1   Period Days   Status Achieved          Long Term Clinic Goals - 12/04/15 1239    CC Long Term Goal  #1   Title Pt to report decreased pain in right shoulder upon palpation of 2/10 to allow increase comfort   Baseline 7/10   Time 4   Period Weeks   Status New   CC Long Term Goal  #2   Title Pt to be independent in a home exercise program for scapular stabilization to help decrease right shoulder pain   Time 4   Period Weeks   Status New   CC Long Term Goal  #3   Title Pt will be able to verbalize lymphedema risk reduction practices   Time 4   Period Weeks   Status New            Plan - 12/14/15 1235    Clinical Impression Statement Patient with little pain today, located at anterior aspect of right subacromial space.  Negative impingement tests today.  She tolerated treatment well; some stretch positions didn't do much for her, but she did get good pect stretches with some of what we did.  She reported feeling looser at end of session.   Rehab Potential Excellent   Clinical Impairments Affecting Rehab Potential started radiation approx. 12/11/15   PT Frequency 2x / week   PT Duration 4 weeks   PT Treatment/Interventions Manual techniques;Taping;Therapeutic exercise;Iontophoresis 4mg /ml Dexamethasone;Therapeutic activities;ADLs/Self Care Home Management   PT Next Visit Plan assess pain further; assess how she felt after first treatment session; continue soft tissue mobilization of right chest if radiated skin is not irritated   PT Home Exercise Plan right chest stretch in supine hooklying lower trunk rotation with arms outstretched      Patient will benefit from skilled therapeutic intervention in order to improve the following deficits and impairments:  Decreased strength, Decreased  knowledge of precautions, Pain, Impaired UE functional use, Increased fascial restricitons  Visit Diagnosis: Pain in right shoulder     Problem List Patient Active Problem List   Diagnosis Date Noted  . Genetic testing 11/05/2015  . Family history of breast cancer in mother 10/11/2015  . Family history of colon cancer 10/11/2015  . History of colonic polyps 10/11/2015  . Breast cancer of upper-outer quadrant of right female breast ([place]) 10/05/2015    SALISBURY,DONNA 12/14/2015, 12:38 PM  [place], [place], [address] Phone: [phone]   Fax:  [phone]  Name: Dorothy Gallagher MRN: [id number] Date of Birth: [birth year]  Serafina Royals, PT 12/14/2015 12:38 PM

## 2015-12-14 NOTE — Progress Notes (Signed)
   Department of Radiation Oncology  Phone:  [phone] Fax:        [phone]  Weekly Treatment Note    Name: Dorothy Gallagher Date: 12/14/2015 MRN: [id number] DOB: 13-Oct-1970   Diagnosis:     ICD-9-CM ICD-10-CM   1. Breast cancer of upper-outer quadrant of right female breast ([place]) 174.4 C50.411      Current dose: 7.2 Gy  Current fraction: 4   MEDICATIONS: Current Outpatient Prescriptions  Medication Sig Dispense Refill  . hyaluronate sodium (RADIAPLEXRX) GEL Apply 1 application topically 2 (two) times daily.    . non-metallic deodorant [name]) MISC Apply 1 application topically daily as needed.    . ranitidine (ZANTAC) 150 MG tablet Take 150 mg by mouth 2 (two) times daily.     No current facility-administered medications for this encounter.     ALLERGIES: Review of patient's allergies indicates no known allergies.   LABORATORY DATA:  Lab Results  Component Value Date   WBC 7.3 10/10/2015   HGB 12.6 10/10/2015   HCT 37.2 10/10/2015   MCV 85.7 10/10/2015   PLT 304 10/10/2015   Lab Results  Component Value Date   NA 141 10/10/2015   K 4.2 10/10/2015   CO2 25 10/10/2015   Lab Results  Component Value Date   ALT 13 10/10/2015   AST 14 10/10/2015   ALKPHOS 68 10/10/2015   BILITOT <0.30 10/10/2015     NARRATIVE: Dorothy Gallagher was seen today for weekly treatment management. The chart was checked and the patient's films were reviewed.  Dorothy Gallagher is here for her 4th fraction of radiation to her Right Breast. She denies pain or fatigue. She has had no skin changes at this point, she is using the radiaplex cream twice daily.   BP 151/93 mmHg  Pulse 73  Temp(Src) 98.3 F (36.8 C)  Ht 5\' 4"  (1.626 m)  Wt 202 lb 4.8 oz (91.763 kg)  BMI 34.71 kg/m2  PHYSICAL EXAMINATION: height is 5\' 4"  (1.626 m) and weight is 202 lb 4.8 oz (91.763 kg). Her temperature is 98.3 F (36.8 C). Her blood pressure is 151/93 and her pulse is 73.        ASSESSMENT: The  patient is doing satisfactorily with treatment.  PLAN: We will continue with the patient's radiation treatment as planned.

## 2015-12-14 NOTE — Progress Notes (Signed)
Ms. Curtright is here for her 4th fraction of radiation to her Right Breast. She denies pain or fatigue. She has had no skin changes at this point, she is using the radiaplex cream twice daily.   BP 151/93 mmHg  Pulse 73  Temp(Src) 98.3 F (36.8 C)  Ht 5\' 4"  (1.626 m)  Wt 202 lb 4.8 oz (91.763 kg)  BMI 34.71 kg/m2

## 2015-12-17 ENCOUNTER — Ambulatory Visit
Admission: RE | Admit: 2015-12-17 | Discharge: 2015-12-17 | Disposition: A | Discharge status: Home / Self Care | Payer: Managed Care, Other (non HMO) | Source: Ambulatory Visit | Attending: Radiation Oncology | Admitting: Radiation Oncology

## 2015-12-17 DIAGNOSIS — Z510 Encounter for antineoplastic radiation therapy: Secondary | ICD-10-CM | POA: Diagnosis not present

## 2015-12-18 ENCOUNTER — Ambulatory Visit: Payer: Managed Care, Other (non HMO) | Admitting: Physical Therapy

## 2015-12-18 ENCOUNTER — Ambulatory Visit
Admission: RE | Admit: 2015-12-18 | Discharge: 2015-12-18 | Disposition: A | Discharge status: Home / Self Care | Payer: Managed Care, Other (non HMO) | Source: Ambulatory Visit | Attending: Radiation Oncology | Admitting: Radiation Oncology

## 2015-12-18 ENCOUNTER — Encounter: Payer: Self-pay | Admitting: Physical Therapy

## 2015-12-18 DIAGNOSIS — M25511 Pain in right shoulder: Secondary | ICD-10-CM | POA: Diagnosis not present

## 2015-12-18 DIAGNOSIS — M6281 Muscle weakness (generalized): Secondary | ICD-10-CM

## 2015-12-18 DIAGNOSIS — Z510 Encounter for antineoplastic radiation therapy: Secondary | ICD-10-CM | POA: Diagnosis not present

## 2015-12-18 NOTE — Therapy (Signed)
[place], [place], [address] Phone: [phone]   Fax:  [phone]  Physical Therapy Treatment  Patient Details  Name: Dorothy Gallagher MRN: [id number] Date of Birth: 1971-03-13 Referring Provider: Dr. Rolm Bookbinder  Encounter Date: 12/18/2015      PT End of Session - 12/18/15 1224    Visit Number 3   Number of Visits 9   Date for PT Re-Evaluation 01/08/16   PT Start Time 1016   PT Stop Time 1105   PT Time Calculation (min) 49 min   Activity Tolerance Patient tolerated treatment well   Behavior During Therapy [facility] for tasks assessed/performed      Past Medical History  Diagnosis Date  . Breast cancer of upper-outer quadrant of right female breast ([place]) 10/04/2015  . Breast cancer ([place])   . Hearing loss     right ear  . Snores   . GERD (gastroesophageal reflux disease)   . H/O colonoscopy 2016    Past Surgical History  Procedure Laterality Date  . Cholecystectomy    . Wisdom tooth extraction    . Essure tubal ligation    . Radioactive seed guided mastectomy with axillary sentinel lymph node biopsy Right 10/31/2015    Procedure: RADIOACTIVE SEED GUIDED RIGHT PARTIAL MASTECTOMY WITH RIGHT AXILLARY SENTINEL LYMPH NODE BIOPSY;  Surgeon: Rolm Bookbinder, MD;  Location: [place];  Service: General;  Laterality: Right;    There were no vitals filed for this visit.      Subjective Assessment - 12/18/15 1018    Subjective My shoulder is doing fine today.    Pertinent History Patient was diagnosed on 09/21/15 with right grade 2 invasive ductal carcinoma with DCIS.  It is ER/PR positive, HER2 negative and has a Ki67 of 10%.  Her mass measures 1.4 cm and is located in the right upper outer quadrant. Pt underwent lumpectomy with SLNB on 10/31/15. She will begin radiation soon.    Patient Stated Goals to be pain free    Currently in Pain? No/denies   Pain Score 0-No pain                          [facility] Adult PT Treatment/Exercise - 12/18/15 0001    Shoulder Exercises: Supine   Other Supine Exercises hooklying with arms outstretched, lower trunk rotation for right chest stretch x3   Shoulder Exercises: Sidelying   Other Sidelying Exercises on left side, D2 with right arm to the point of stretch at right chest x 10   Manual Therapy   Manual Therapy Soft tissue mobilization;Scapular mobilization;Passive ROM   Myofascial Release in supine to right pec, axilla, and scar   Scapular Mobilization in left sidelying to right shoulder, especially for protraction and depression with stretch of right upper trap   Passive ROM in left sidelying into D2                     Long Term Clinic Goals - 12/04/15 1239    CC Long Term Goal  #1   Title Pt to report decreased pain in right shoulder upon palpation of 2/10 to allow increase comfort   Baseline 7/10   Time 4   Period Weeks   Status New   CC Long Term Goal  #2   Title Pt to be independent in a home exercise program for scapular stabilization to help decrease right shoulder pain  Time 4   Period Weeks   Status New   CC Long Term Goal  #3   Title Pt will be able to verbalize lymphedema risk reduction practices   Time 4   Period Weeks   Status New            Plan - 12/18/15 1224    Clinical Impression Statement Patient is progressing towards goals in therapy. She has increased tightness in right pec and axilla. Soft tissue mobilization performed to this area to help decrease tightness. Pt stated she felt better at end of session. She had decreased pain today.    Rehab Potential Excellent   Clinical Impairments Affecting Rehab Potential started radiation approx. 12/11/15   PT Frequency 2x / week   PT Duration 4 weeks   PT Treatment/Interventions Manual techniques;Taping;Therapeutic exercise;Iontophoresis 7m/ml Dexamethasone;Therapeutic activities;ADLs/Self Care Home Management    PT Next Visit Plan soft tissue mobilization right chest, stretching to right pec, assess pain   PT Home Exercise Plan right chest stretch in supine hooklying lower trunk rotation with arms outstretched   Consulted and Agree with Plan of Care Patient      Patient will benefit from skilled therapeutic intervention in order to improve the following deficits and impairments:  Decreased strength, Decreased knowledge of precautions, Pain, Impaired UE functional use, Increased fascial restricitons  Visit Diagnosis: Pain in right shoulder  Muscle weakness (generalized)     Problem List Patient Active Problem List   Diagnosis Date Noted  . Genetic testing 11/05/2015  . Family history of breast cancer in mother 10/11/2015  . Family history of colon cancer 10/11/2015  . History of colonic polyps 10/11/2015  . Breast cancer of upper-outer quadrant of right female breast ([place] 10/05/2015    BAlexia Freestone5/16/2017, 12:27 PM  [place] [address]Phone: [phone]  Fax:  [phone] Name: Dorothy MAHMOODMRN: [id number] of Birth: 109/04/1971   BAllyson Sabal PT 12/18/2015 12:27 PM

## 2015-12-19 ENCOUNTER — Ambulatory Visit
Admission: RE | Admit: 2015-12-19 | Discharge: 2015-12-19 | Disposition: A | Discharge status: Home / Self Care | Payer: Managed Care, Other (non HMO) | Source: Ambulatory Visit | Attending: Radiation Oncology | Admitting: Radiation Oncology

## 2015-12-19 ENCOUNTER — Telehealth: Payer: Self-pay | Admitting: *Deleted

## 2015-12-19 DIAGNOSIS — Z510 Encounter for antineoplastic radiation therapy: Secondary | ICD-10-CM | POA: Diagnosis not present

## 2015-12-19 NOTE — Telephone Encounter (Signed)
Spoke with patient to follow up after start of radiation.  She states she is doing well.  Encouraged her to call with any needs or concerns. 

## 2015-12-20 ENCOUNTER — Ambulatory Visit: Payer: Managed Care, Other (non HMO) | Admitting: Physical Therapy

## 2015-12-20 ENCOUNTER — Ambulatory Visit
Admission: RE | Admit: 2015-12-20 | Discharge: 2015-12-20 | Disposition: A | Discharge status: Home / Self Care | Payer: Managed Care, Other (non HMO) | Source: Ambulatory Visit | Attending: Radiation Oncology | Admitting: Radiation Oncology

## 2015-12-20 ENCOUNTER — Encounter: Payer: Self-pay | Admitting: Physical Therapy

## 2015-12-20 DIAGNOSIS — M25511 Pain in right shoulder: Secondary | ICD-10-CM

## 2015-12-20 DIAGNOSIS — M6281 Muscle weakness (generalized): Secondary | ICD-10-CM

## 2015-12-20 DIAGNOSIS — Z510 Encounter for antineoplastic radiation therapy: Secondary | ICD-10-CM | POA: Diagnosis not present

## 2015-12-20 NOTE — Therapy (Signed)
[place], [place], [address] Phone: [phone]   Fax:  [phone]  Physical Therapy Treatment  Patient Details  Name: Dorothy Gallagher MRN: [id number] Date of Birth: 1970-09-30 Referring Provider: Dr. Rolm Bookbinder  Encounter Date: 12/20/2015      PT End of Session - 12/20/15 1133    Visit Number 4   Number of Visits 9   Date for PT Re-Evaluation 01/08/16   PT Start Time 0076   PT Stop Time 1120   PT Time Calculation (min) 40 min   Activity Tolerance Patient tolerated treatment well   Behavior During Therapy [facility] for tasks assessed/performed      Past Medical History  Diagnosis Date  . Breast cancer of upper-outer quadrant of right female breast ([place]) 10/04/2015  . Breast cancer ([place])   . Hearing loss     right ear  . Snores   . GERD (gastroesophageal reflux disease)   . H/O colonoscopy 2016    Past Surgical History  Procedure Laterality Date  . Cholecystectomy    . Wisdom tooth extraction    . Essure tubal ligation    . Radioactive seed guided mastectomy with axillary sentinel lymph node biopsy Right 10/31/2015    Procedure: RADIOACTIVE SEED GUIDED RIGHT PARTIAL MASTECTOMY WITH RIGHT AXILLARY SENTINEL LYMPH NODE BIOPSY;  Surgeon: Rolm Bookbinder, MD;  Location: [place];  Service: General;  Laterality: Right;    There were no vitals filed for this visit.      Subjective Assessment - 12/20/15 1043    Subjective I feel like I'm doing well.  My shoulder hasn't hurt me since over a week.   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  It is ER/PR positive, HER2 negative and has a Ki67 of 10%.  Her mass measures 1.4 cm and is located in the right upper outer quadrant. Pt underwent lumpectomy with SLNB on 10/31/15. She will begin radiation soon.    Patient Stated Goals  to be pain free    Currently in Pain? No/denies            [facility] PT Assessment - 12/20/15 0001    AROM   Right Shoulder Extension 63 Degrees   Right Shoulder Flexion 164 Degrees   Right Shoulder ABduction 160 Degrees   Right Shoulder Internal Rotation 70 Degrees   Right Shoulder External Rotation 84 Degrees   Strength   Right Shoulder Flexion 5/5   Right Shoulder ABduction 5/5   Right Shoulder Internal Rotation 5/5   Right Shoulder External Rotation 5/5              Quick Dash - 12/20/15 0001    Open a tight or new jar No difficulty   Do heavy household chores (wash walls, wash floors) No difficulty   Carry a shopping bag or briefcase No difficulty   Wash your back No difficulty   Use a knife to cut food No difficulty   Recreational activities in which you take some force or impact through your arm, shoulder, or hand (golf, hammering, tennis) No difficulty   During the past week, to what extent has your arm, shoulder or hand problem interfered with your normal social activities with family, friends, neighbors, or groups? Not at all   During the past week, to what extent has your arm, shoulder or hand problem limited your  work or other regular daily activities Not at all   Arm, shoulder, or hand pain. None   Tingling (pins and needles) in your arm, shoulder, or hand None   Difficulty Sleeping No difficulty   DASH Score 0 %               [facility] Adult PT Treatment/Exercise - 12/20/15 0001    Manual Therapy   Manual Therapy Myofascial release;Soft tissue mobilization;Passive ROM;Neural Stretch   Myofascial Release in supine to right pec, axilla, and scar   Passive ROM All planes right shoulder focused on flexion and abduction to end ROM   Neural Stretch Right UE, passive                PT Education - 12/20/15 1133    Education provided Yes   Education Details Chest corner stretch   Person(s) Educated Patient   Methods  Explanation;Demonstration;Handout   Comprehension Returned demonstration;Verbalized understanding              Breast Clinic Goals - 10/10/15 1618    Patient will be able to verbalize understanding of pertinent lymphedema risk reduction practices relevant to her diagnosis specifically related to skin care.   Time 1   Period Days   Status Achieved   Patient will be able to return demonstrate and/or verbalize understanding of the post-op home exercise program related to regaining shoulder range of motion.   Time 1   Period Days   Status Achieved   Patient will be able to verbalize understanding of the importance of attending the postoperative After Breast Cancer Class for further lymphedema risk reduction education and therapeutic exercise.   Time 1   Period Days   Status Achieved          Long Term Clinic Goals - 12/20/15 1134    CC Long Term Goal  #1   Title Pt to report decreased pain in right shoulder upon palpation of 2/10 to allow increase comfort   Time 4   Period Weeks   Status Achieved   CC Long Term Goal  #2   Title Pt to be independent in a home exercise program for scapular stabilization to help decrease right shoulder pain   Time 4   Period Weeks   Status Achieved   CC Long Term Goal  #3   Title Pt will be able to verbalize lymphedema risk reduction practices   Time 4   Period Weeks   Status Achieved            Plan - 12/20/15 1134    Clinical Impression Statement Patient is doing quite well!  She has met all goals, has no c/o pain, and has full shoulder ROM and strength.  There is no further need for PT.   Rehab Potential Excellent   PT Frequency 2x / week   PT Duration 4 weeks   PT Treatment/Interventions Manual techniques;Taping;Therapeutic exercise;Iontophoresis 26m/ml Dexamethasone;Therapeutic activities;ADLs/Self Care Home Management   PT Next Visit Plan Discharge pt due to meeting all goals   PT Home Exercise Plan Corner stretch    Consulted and Agree with Plan of Care Patient      Patient will benefit from skilled therapeutic intervention in order to improve the following deficits and impairments:  Decreased strength, Decreased knowledge of precautions, Pain, Impaired UE functional use, Increased fascial restricitons  Visit Diagnosis: Pain in right shoulder  Muscle weakness (generalized)     Problem List Patient Active Problem List   Diagnosis  Date Noted  . Genetic testing 11/05/2015  . Family history of breast cancer in mother 10/11/2015  . Family history of colon cancer 10/11/2015  . History of colonic polyps 10/11/2015  . Breast cancer of upper-outer quadrant of right female breast ([place]) 10/05/2015    Amandeep Hogston,MARTI COOPER 12/20/2015, 11:36 AM  [place], [place], [address] Phone: [phone]   Fax:  [phone]  Name: JERNI SELMER MRN: [id number] Date of Birth: Dec 04, 1970    PHYSICAL THERAPY DISCHARGE SUMMARY  Visits from Start of Care: 4  Current functional level related to goals / functional outcomes: All goals met; see above findings   Remaining deficits: None   Education / Equipment: Home exercise program  Plan: Patient agrees to discharge.  Patient goals were not met. Patient is being discharged due to meeting the stated rehab goals.  ?????

## 2015-12-20 NOTE — Patient Instructions (Signed)
Corner Push    Stand in a corner or in a doorframe with a hand on each wall at shoulder level, and feet _12___ inches from walls. Gently lean body toward corner. Hold __10__ seconds. Repeat __3__ times. Do __2__ sessions per day.  http://gt2.exer.us/572   Copyright  VHI. All rights reserved.

## 2015-12-21 ENCOUNTER — Other Ambulatory Visit: Payer: Self-pay | Admitting: Radiation Oncology

## 2015-12-21 ENCOUNTER — Ambulatory Visit
Admission: RE | Admit: 2015-12-21 | Discharge: 2015-12-21 | Disposition: A | Discharge status: Home / Self Care | Payer: Managed Care, Other (non HMO) | Source: Ambulatory Visit | Attending: Radiation Oncology | Admitting: Radiation Oncology

## 2015-12-21 ENCOUNTER — Encounter: Payer: Self-pay | Admitting: Radiation Oncology

## 2015-12-21 VITALS — BP 141/92 | HR 66 | Temp 98.3°F | Resp 16 | Ht 64.0 in | Wt 203.2 lb

## 2015-12-21 DIAGNOSIS — Z510 Encounter for antineoplastic radiation therapy: Secondary | ICD-10-CM | POA: Diagnosis not present

## 2015-12-21 DIAGNOSIS — M79671 Pain in right foot: Secondary | ICD-10-CM

## 2015-12-21 DIAGNOSIS — C50411 Malignant neoplasm of upper-outer quadrant of right female breast: Secondary | ICD-10-CM

## 2015-12-21 NOTE — Progress Notes (Signed)
Dorothy Gallagher has completed 9 fractions to her right breast.  She denies having pain.  She reports having fatigue on Monday but has had none since.  She is using radiaplex.  The skin on her right breat is pink.  BP 141/92 mmHg  Pulse 66  Temp(Src) 98.3 F (36.8 C) (Oral)  Resp 16  Ht 5\' 4"  (1.626 m)  Wt 203 lb 3.2 oz (92.171 kg)  BMI 34.86 kg/m2

## 2015-12-21 NOTE — Progress Notes (Signed)
   Department of Radiation Oncology  Phone:  [phone] Fax:        [phone]  Weekly Treatment Note    Name: Dorothy Gallagher Date: 12/21/2015 MRN: [id number] DOB: May 16, 1971   Diagnosis:     ICD-9-CM ICD-10-CM   1. Breast cancer of upper-outer quadrant of right female breast ([place]) 174.4 C50.411      Current dose: 16.2 Gy  Current fraction: 9   MEDICATIONS: Current Outpatient Prescriptions  Medication Sig Dispense Refill  . hyaluronate sodium (RADIAPLEXRX) GEL Apply 1 application topically 2 (two) times daily.    . non-metallic deodorant [name]) MISC Apply 1 application topically daily as needed.    . ranitidine (ZANTAC) 150 MG tablet Take 150 mg by mouth 2 (two) times daily.     No current facility-administered medications for this encounter.     ALLERGIES: Review of patient's allergies indicates no known allergies.   LABORATORY DATA:  Lab Results  Component Value Date   WBC 7.3 10/10/2015   HGB 12.6 10/10/2015   HCT 37.2 10/10/2015   MCV 85.7 10/10/2015   PLT 304 10/10/2015   Lab Results  Component Value Date   NA 141 10/10/2015   K 4.2 10/10/2015   CO2 25 10/10/2015   Lab Results  Component Value Date   ALT 13 10/10/2015   AST 14 10/10/2015   ALKPHOS 68 10/10/2015   BILITOT <0.30 10/10/2015     NARRATIVE: Dorothy Gallagher was seen today for weekly treatment management. The chart was checked and the patient's films were reviewed.  Dorothy Gallagher has completed 9 fractions to her right breast.  She denies having pain.  She reports having fatigue on Monday but has had none since.  She is using radiaplex.  The skin on her right breat is pink.  BP 141/92 mmHg  Pulse 66  Temp(Src) 98.3 F (36.8 C) (Oral)  Resp 16  Ht 5\' 4"  (1.626 m)  Wt 203 lb 3.2 oz (92.171 kg)  BMI 34.86 kg/m2  PHYSICAL EXAMINATION: height is 5\' 4"  (1.626 m) and weight is 203 lb 3.2 oz (92.171 kg). Her oral temperature is 98.3 F (36.8 C). Her blood pressure is 141/92 and her  pulse is 66. Her respiration is 16.      Slight erythema present  ASSESSMENT: The patient is doing satisfactorily with treatment.  PLAN: We will continue with the patient's radiation treatment as planned.

## 2015-12-24 ENCOUNTER — Ambulatory Visit
Admission: RE | Admit: 2015-12-24 | Discharge: 2015-12-24 | Disposition: A | Discharge status: Home / Self Care | Payer: Managed Care, Other (non HMO) | Source: Ambulatory Visit | Attending: Radiation Oncology | Admitting: Radiation Oncology

## 2015-12-24 ENCOUNTER — Ambulatory Visit: Payer: Managed Care, Other (non HMO) | Admitting: Physical Therapy

## 2015-12-24 DIAGNOSIS — Z510 Encounter for antineoplastic radiation therapy: Secondary | ICD-10-CM | POA: Diagnosis not present

## 2015-12-25 ENCOUNTER — Ambulatory Visit
Admission: RE | Admit: 2015-12-25 | Discharge: 2015-12-25 | Disposition: A | Discharge status: Home / Self Care | Payer: Managed Care, Other (non HMO) | Source: Ambulatory Visit | Attending: Radiation Oncology | Admitting: Radiation Oncology

## 2015-12-25 DIAGNOSIS — Z510 Encounter for antineoplastic radiation therapy: Secondary | ICD-10-CM | POA: Diagnosis not present

## 2015-12-26 ENCOUNTER — Ambulatory Visit: Payer: Managed Care, Other (non HMO)

## 2015-12-26 ENCOUNTER — Ambulatory Visit
Admission: RE | Admit: 2015-12-26 | Discharge: 2015-12-26 | Disposition: A | Discharge status: Home / Self Care | Payer: Managed Care, Other (non HMO) | Source: Ambulatory Visit | Attending: Radiation Oncology | Admitting: Radiation Oncology

## 2015-12-26 DIAGNOSIS — Z510 Encounter for antineoplastic radiation therapy: Secondary | ICD-10-CM | POA: Diagnosis not present

## 2015-12-27 ENCOUNTER — Ambulatory Visit
Admission: RE | Admit: 2015-12-27 | Discharge: 2015-12-27 | Disposition: A | Discharge status: Home / Self Care | Payer: Managed Care, Other (non HMO) | Source: Ambulatory Visit | Attending: Radiation Oncology | Admitting: Radiation Oncology

## 2015-12-27 ENCOUNTER — Encounter: Payer: Managed Care, Other (non HMO) | Admitting: Physical Therapy

## 2015-12-27 DIAGNOSIS — Z510 Encounter for antineoplastic radiation therapy: Secondary | ICD-10-CM | POA: Diagnosis not present

## 2015-12-28 ENCOUNTER — Ambulatory Visit
Admission: RE | Admit: 2015-12-28 | Discharge: 2015-12-28 | Disposition: A | Discharge status: Home / Self Care | Payer: Managed Care, Other (non HMO) | Source: Ambulatory Visit | Attending: Radiation Oncology | Admitting: Radiation Oncology

## 2015-12-28 ENCOUNTER — Encounter: Payer: Self-pay | Admitting: Radiation Oncology

## 2015-12-28 VITALS — BP 142/89 | HR 65 | Temp 97.7°F | Resp 20 | Wt 204.0 lb

## 2015-12-28 DIAGNOSIS — Z923 Personal history of irradiation: Secondary | ICD-10-CM | POA: Insufficient documentation

## 2015-12-28 DIAGNOSIS — C50411 Malignant neoplasm of upper-outer quadrant of right female breast: Secondary | ICD-10-CM

## 2015-12-28 DIAGNOSIS — Z510 Encounter for antineoplastic radiation therapy: Secondary | ICD-10-CM | POA: Diagnosis not present

## 2015-12-28 MED ORDER — RADIAPLEXRX EX GEL
Freq: Once | CUTANEOUS | Status: AC
  Administered 2015-12-28: 10:00:00 via TOPICAL

## 2015-12-28 NOTE — Progress Notes (Signed)
   Department of Radiation Oncology  Phone:  [phone] Fax:        [phone]  Weekly Treatment Note    Name: Dorothy Gallagher Date: 12/28/2015 MRN: [id number] DOB: 23-Sep-1970   Diagnosis:     ICD-9-CM ICD-10-CM   1. Breast cancer of upper-outer quadrant of right female breast ([facility]) 174.4 C50.411 hyaluronate sodium (RADIAPLEXRX) gel     Current dose: 25.2 Gy  Current fraction: 14   MEDICATIONS: Current Outpatient Prescriptions  Medication Sig Dispense Refill  . hyaluronate sodium (RADIAPLEXRX) GEL Apply 1 application topically 2 (two) times daily.    . non-metallic deodorant [name]) MISC Apply 1 application topically daily as needed.    . ranitidine (ZANTAC) 150 MG tablet Take 150 mg by mouth 2 (two) times daily.     No current facility-administered medications for this encounter.     ALLERGIES: Review of patient's allergies indicates no known allergies.   LABORATORY DATA:  Lab Results  Component Value Date   WBC 7.3 10/10/2015   HGB 12.6 10/10/2015   HCT 37.2 10/10/2015   MCV 85.7 10/10/2015   PLT 304 10/10/2015   Lab Results  Component Value Date   NA 141 10/10/2015   K 4.2 10/10/2015   CO2 25 10/10/2015   Lab Results  Component Value Date   ALT 13 10/10/2015   AST 14 10/10/2015   ALKPHOS 68 10/10/2015   BILITOT <0.30 10/10/2015     NARRATIVE: Dorothy Gallagher was seen today for weekly treatment management. The chart was checked and the patient's films were reviewed.  Weekly rad tx right breast 14 completed, mild erythema  Occasionally tingling in breat,nipple  Tender, no c/o pain,gave another radiaplex ,appetite good 6:21 PM BP 142/89 mmHg  Pulse 65  Temp(Src) 97.7 F (36.5 C) (Oral)  Resp 20  Wt 204 lb (92.534 kg)  Wt Readings from Last 3 Encounters:  12/28/15 204 lb (92.534 kg)  12/21/15 203 lb 3.2 oz (92.171 kg)  12/14/15 202 lb 4.8 oz (91.763 kg)    PHYSICAL EXAMINATION: weight is 204 lb (92.534 kg). Her oral temperature is 97.7 F  (36.5 C). Her blood pressure is 142/89 and her pulse is 65. Her respiration is 20.      Some hyperpigmentation present in the treatment area. Overall her skin looks good.  ASSESSMENT: The patient is doing satisfactorily with treatment.  PLAN: We will continue with the patient's radiation treatment as planned.

## 2015-12-28 NOTE — Progress Notes (Signed)
Weekly rad tx right breast 14 completed, mild erythema  Occasionally tingling in breat,nipple  Tender, no c/o pain,gave another radiaplex ,appetite good 8:58 AM BP 142/89 mmHg  Pulse 65  Temp(Src) 97.7 F (36.5 C) (Oral)  Resp 20  Wt 204 lb (92.534 kg)  Wt Readings from Last 3 Encounters:  12/28/15 204 lb (92.534 kg)  12/21/15 203 lb 3.2 oz (92.171 kg)  12/14/15 202 lb 4.8 oz (91.763 kg)

## 2016-01-01 ENCOUNTER — Ambulatory Visit
Admission: RE | Admit: 2016-01-01 | Discharge: 2016-01-01 | Disposition: A | Discharge status: Home / Self Care | Payer: Managed Care, Other (non HMO) | Source: Ambulatory Visit | Attending: Radiation Oncology | Admitting: Radiation Oncology

## 2016-01-01 DIAGNOSIS — Z510 Encounter for antineoplastic radiation therapy: Secondary | ICD-10-CM | POA: Diagnosis not present

## 2016-01-02 ENCOUNTER — Ambulatory Visit
Admission: RE | Admit: 2016-01-02 | Discharge: 2016-01-02 | Disposition: A | Discharge status: Home / Self Care | Payer: Managed Care, Other (non HMO) | Source: Ambulatory Visit | Attending: Radiation Oncology | Admitting: Radiation Oncology

## 2016-01-02 ENCOUNTER — Encounter: Payer: Self-pay | Admitting: Radiation Oncology

## 2016-01-02 DIAGNOSIS — Z510 Encounter for antineoplastic radiation therapy: Secondary | ICD-10-CM | POA: Diagnosis not present

## 2016-01-03 ENCOUNTER — Ambulatory Visit
Admission: RE | Admit: 2016-01-03 | Discharge: 2016-01-03 | Disposition: A | Discharge status: Home / Self Care | Payer: Managed Care, Other (non HMO) | Source: Ambulatory Visit | Attending: Radiation Oncology | Admitting: Radiation Oncology

## 2016-01-03 DIAGNOSIS — Z510 Encounter for antineoplastic radiation therapy: Secondary | ICD-10-CM | POA: Diagnosis not present

## 2016-01-04 ENCOUNTER — Ambulatory Visit
Admission: RE | Admit: 2016-01-04 | Discharge: 2016-01-04 | Disposition: A | Discharge status: Home / Self Care | Payer: Managed Care, Other (non HMO) | Source: Ambulatory Visit | Attending: Radiation Oncology | Admitting: Radiation Oncology

## 2016-01-04 ENCOUNTER — Encounter: Payer: Self-pay | Admitting: Radiation Oncology

## 2016-01-04 VITALS — BP 139/89 | HR 82 | Temp 98.2°F | Resp 20 | Wt 204.6 lb

## 2016-01-04 DIAGNOSIS — Z510 Encounter for antineoplastic radiation therapy: Secondary | ICD-10-CM | POA: Diagnosis not present

## 2016-01-04 DIAGNOSIS — C50411 Malignant neoplasm of upper-outer quadrant of right female breast: Secondary | ICD-10-CM

## 2016-01-04 NOTE — Progress Notes (Signed)
   Department of Radiation Oncology  Phone:  [phone] Fax:        [phone]  Weekly Treatment Note    Name: Dorothy Gallagher Date: 01/04/2016 MRN: [id number] DOB: Sep 16, 1970   Diagnosis:     ICD-9-CM ICD-10-CM   1. Breast cancer of upper-outer quadrant of right female breast ([place]) 174.4 C50.411      Current dose: 32.4 Gy  Current fraction: 18   MEDICATIONS: Current Outpatient Prescriptions  Medication Sig Dispense Refill  . hyaluronate sodium (RADIAPLEXRX) GEL Apply 1 application topically 2 (two) times daily.    . non-metallic deodorant [name]) MISC Apply 1 application topically daily as needed.    . ranitidine (ZANTAC) 150 MG tablet Take 150 mg by mouth 2 (two) times daily.     No current facility-administered medications for this encounter.     ALLERGIES: Review of patient's allergies indicates no known allergies.   LABORATORY DATA:  Lab Results  Component Value Date   WBC 7.3 10/10/2015   HGB 12.6 10/10/2015   HCT 37.2 10/10/2015   MCV 85.7 10/10/2015   PLT 304 10/10/2015   Lab Results  Component Value Date   NA 141 10/10/2015   K 4.2 10/10/2015   CO2 25 10/10/2015   Lab Results  Component Value Date   ALT 13 10/10/2015   AST 14 10/10/2015   ALKPHOS 68 10/10/2015   BILITOT <0.30 10/10/2015     NARRATIVE: CHAUNTIA RICO was seen today for weekly treatment management. The chart was checked and the patient's films were reviewed.  Weekly rad txs right breast 18/ completed ,mild erythema skin intact, a little raw  Looking  Under right axilla  radiaplex bid,, appetite good, good energy level, slight tingling occasionally in breast, no pain, just slight itching in mid breast.chest area, will get benadryl cream and use prn,   Wt Readings from Last 3 Encounters:  01/04/16 204 lb 9.6 oz (92.806 kg)  12/28/15 204 lb (92.534 kg)  12/21/15 203 lb 3.2 oz (92.171 kg)  BP 139/89 mmHg  Pulse 82  Temp(Src) 98.2 F (36.8 C) (Oral)  Resp 20  Wt 204 lb 9.6  oz (92.806 kg)  PHYSICAL EXAMINATION: weight is 204 lb 9.6 oz (92.806 kg). Her oral temperature is 98.2 F (36.8 C). Her blood pressure is 139/89 and her pulse is 82. Her respiration is 20.      Overall the skin looks quite good. No moist desquamation.  ASSESSMENT: The patient is doing satisfactorily with treatment.  PLAN: We will continue with the patient's radiation treatment as planned.

## 2016-01-04 NOTE — Progress Notes (Addendum)
Weekly rad txs right breast 18/ completed ,mild erythema skin intact, a little raw  Looking  Under right axilla  radiaplex bid,, appetite good, good energy level, slight tingling occasionally in breast, no pain, just slight itching in mid breast.chest area, will get benadryl cream and use prn,   Wt Readings from Last 3 Encounters:  12/28/15 204 lb (92.534 kg)  12/21/15 203 lb 3.2 oz (92.171 kg)  12/14/15 202 lb 4.8 oz (91.763 kg)  BP 139/89 mmHg  Pulse 82  Temp(Src) 98.2 F (36.8 C) (Oral)  Resp 20  Wt 204 lb 9.6 oz (92.806 kg)

## 2016-01-07 ENCOUNTER — Ambulatory Visit
Admission: RE | Admit: 2016-01-07 | Discharge: 2016-01-07 | Disposition: A | Discharge status: Home / Self Care | Payer: Managed Care, Other (non HMO) | Source: Ambulatory Visit | Attending: Radiation Oncology | Admitting: Radiation Oncology

## 2016-01-07 DIAGNOSIS — Z510 Encounter for antineoplastic radiation therapy: Secondary | ICD-10-CM | POA: Diagnosis not present

## 2016-01-08 ENCOUNTER — Ambulatory Visit
Admission: RE | Admit: 2016-01-08 | Discharge: 2016-01-08 | Disposition: A | Discharge status: Home / Self Care | Payer: Managed Care, Other (non HMO) | Source: Ambulatory Visit | Attending: Radiation Oncology | Admitting: Radiation Oncology

## 2016-01-08 DIAGNOSIS — Z510 Encounter for antineoplastic radiation therapy: Secondary | ICD-10-CM | POA: Diagnosis not present

## 2016-01-09 ENCOUNTER — Ambulatory Visit
Admission: RE | Admit: 2016-01-09 | Discharge: 2016-01-09 | Disposition: A | Discharge status: Home / Self Care | Payer: Managed Care, Other (non HMO) | Source: Ambulatory Visit | Attending: Radiation Oncology | Admitting: Radiation Oncology

## 2016-01-09 DIAGNOSIS — Z510 Encounter for antineoplastic radiation therapy: Secondary | ICD-10-CM | POA: Diagnosis not present

## 2016-01-10 ENCOUNTER — Ambulatory Visit
Admission: RE | Admit: 2016-01-10 | Discharge: 2016-01-10 | Disposition: A | Discharge status: Home / Self Care | Payer: Managed Care, Other (non HMO) | Source: Ambulatory Visit | Attending: Radiation Oncology | Admitting: Radiation Oncology

## 2016-01-10 DIAGNOSIS — Z510 Encounter for antineoplastic radiation therapy: Secondary | ICD-10-CM | POA: Diagnosis not present

## 2016-01-11 ENCOUNTER — Ambulatory Visit
Admission: RE | Admit: 2016-01-11 | Discharge: 2016-01-11 | Disposition: A | Discharge status: Home / Self Care | Payer: Managed Care, Other (non HMO) | Source: Ambulatory Visit | Attending: Radiation Oncology | Admitting: Radiation Oncology

## 2016-01-11 ENCOUNTER — Encounter: Payer: Self-pay | Admitting: Radiation Oncology

## 2016-01-11 VITALS — BP 137/92 | HR 82 | Temp 98.1°F | Resp 20 | Wt 204.0 lb

## 2016-01-11 DIAGNOSIS — C50411 Malignant neoplasm of upper-outer quadrant of right female breast: Secondary | ICD-10-CM

## 2016-01-11 DIAGNOSIS — Z510 Encounter for antineoplastic radiation therapy: Secondary | ICD-10-CM | POA: Diagnosis not present

## 2016-01-11 NOTE — Progress Notes (Signed)
weeky  Rad tx right breast  23/28  Completed, erythmea skin intact, under inframmary fold tender and skin in thining,  No other issues stated BP 137/92 mmHg  Pulse 82  Temp(Src) 98.1 F (36.7 C) (Oral)  Resp 20  Wt 204 lb (92.534 kg)  Wt Readings from Last 3 Encounters:  01/11/16 204 lb (92.534 kg)  01/04/16 204 lb 9.6 oz (92.806 kg)  12/28/15 204 lb (92.534 kg)

## 2016-01-13 NOTE — Progress Notes (Signed)
   Department of Radiation Oncology  Phone:  [phone] Fax:        [phone]  Weekly Treatment Note    Name: Dorothy Gallagher Date: 01/13/2016 MRN: [id number] DOB: Apr 27, 1971   Diagnosis:     ICD-9-CM ICD-10-CM   1. Breast cancer of upper-outer quadrant of right female breast ([place]) 174.4 C50.411      Current dose: 41.4 Gy  Current fraction: 23   MEDICATIONS: Current Outpatient Prescriptions  Medication Sig Dispense Refill  . hyaluronate sodium (RADIAPLEXRX) GEL Apply 1 application topically 2 (two) times daily.    . non-metallic deodorant [name]) MISC Apply 1 application topically daily as needed.    . ranitidine (ZANTAC) 150 MG tablet Take 150 mg by mouth 2 (two) times daily.     No current facility-administered medications for this encounter.     ALLERGIES: Review of patient's allergies indicates no known allergies.   LABORATORY DATA:  Lab Results  Component Value Date   WBC 7.3 10/10/2015   HGB 12.6 10/10/2015   HCT 37.2 10/10/2015   MCV 85.7 10/10/2015   PLT 304 10/10/2015   Lab Results  Component Value Date   NA 141 10/10/2015   K 4.2 10/10/2015   CO2 25 10/10/2015   Lab Results  Component Value Date   ALT 13 10/10/2015   AST 14 10/10/2015   ALKPHOS 68 10/10/2015   BILITOT <0.30 10/10/2015     NARRATIVE: Dorothy Gallagher was seen today for weekly treatment management. The chart was checked and the patient's films were reviewed.  weeky  Rad tx right breast  23/28  Completed, erythmea skin intact, under inframmary fold tender and skin in thining,  No other issues stated BP 137/92 mmHg  Pulse 82  Temp(Src) 98.1 F (36.7 C) (Oral)  Resp 20  Wt 204 lb (92.534 kg)  Wt Readings from Last 3 Encounters:  01/11/16 204 lb (92.534 kg)  01/04/16 204 lb 9.6 oz (92.806 kg)  12/28/15 204 lb (92.534 kg)    PHYSICAL EXAMINATION: weight is 204 lb (92.534 kg). Her oral temperature is 98.1 F (36.7 C). Her blood pressure is 137/92 and her pulse is 82. Her  respiration is 20.        ASSESSMENT: The patient is doing satisfactorily with treatment.  PLAN: We will continue with the patient's radiation treatment as planned.

## 2016-01-14 ENCOUNTER — Ambulatory Visit
Admission: RE | Admit: 2016-01-14 | Discharge: 2016-01-14 | Disposition: A | Discharge status: Home / Self Care | Payer: Managed Care, Other (non HMO) | Source: Ambulatory Visit | Attending: Radiation Oncology | Admitting: Radiation Oncology

## 2016-01-14 DIAGNOSIS — Z510 Encounter for antineoplastic radiation therapy: Secondary | ICD-10-CM | POA: Diagnosis not present

## 2016-01-15 ENCOUNTER — Ambulatory Visit
Admission: RE | Admit: 2016-01-15 | Discharge: 2016-01-15 | Disposition: A | Discharge status: Home / Self Care | Payer: Managed Care, Other (non HMO) | Source: Ambulatory Visit | Attending: Radiation Oncology | Admitting: Radiation Oncology

## 2016-01-15 DIAGNOSIS — Z510 Encounter for antineoplastic radiation therapy: Secondary | ICD-10-CM | POA: Diagnosis not present

## 2016-01-16 ENCOUNTER — Ambulatory Visit
Admission: RE | Admit: 2016-01-16 | Discharge: 2016-01-16 | Disposition: A | Discharge status: Home / Self Care | Payer: Managed Care, Other (non HMO) | Source: Ambulatory Visit | Attending: Radiation Oncology | Admitting: Radiation Oncology

## 2016-01-16 ENCOUNTER — Encounter: Payer: Self-pay | Admitting: Radiation Oncology

## 2016-01-16 VITALS — BP 119/94 | HR 74 | Temp 98.0°F | Resp 16

## 2016-01-16 DIAGNOSIS — C50411 Malignant neoplasm of upper-outer quadrant of right female breast: Secondary | ICD-10-CM

## 2016-01-16 DIAGNOSIS — Z510 Encounter for antineoplastic radiation therapy: Secondary | ICD-10-CM | POA: Diagnosis not present

## 2016-01-16 NOTE — Progress Notes (Signed)
   Department of Radiation Oncology  Phone:  [phone] Fax:        [phone]  Weekly Treatment Note    Name: Dorothy Gallagher Date: 01/16/2016 MRN: [id number] DOB: September 30, 1970   Diagnosis:     ICD-9-CM ICD-10-CM   1. Breast cancer of upper-outer quadrant of right female breast ([place]) 174.4 C50.411      Current dose: 46.8 Gy  Current fraction: 26   MEDICATIONS: Current Outpatient Prescriptions  Medication Sig Dispense Refill  . hyaluronate sodium (RADIAPLEXRX) GEL Apply 1 application topically 2 (two) times daily.    . non-metallic deodorant [name]) MISC Apply 1 application topically daily as needed.    . ranitidine (ZANTAC) 150 MG tablet Take 150 mg by mouth 2 (two) times daily.     No current facility-administered medications for this encounter.     ALLERGIES: Review of patient's allergies indicates no known allergies.   LABORATORY DATA:  Lab Results  Component Value Date   WBC 7.3 10/10/2015   HGB 12.6 10/10/2015   HCT 37.2 10/10/2015   MCV 85.7 10/10/2015   PLT 304 10/10/2015   Lab Results  Component Value Date   NA 141 10/10/2015   K 4.2 10/10/2015   CO2 25 10/10/2015   Lab Results  Component Value Date   ALT 13 10/10/2015   AST 14 10/10/2015   ALKPHOS 68 10/10/2015   BILITOT <0.30 10/10/2015     NARRATIVE: Dorothy Gallagher was seen today for weekly treatment management. The chart was checked and the patient's films were reviewed.  The patient has completed 26 treatments to the right breast. The patient is using Radiaplex BID. She reports pruritus of the mid-chest and under her right arm. She has no other complaints at this time.  BP 119/94 mmHg  Pulse 74  Temp(Src) 98 F (36.7 C) (Oral)  Resp 16  Wt Readings from Last 3 Encounters:  01/11/16 204 lb (92.534 kg)  01/04/16 204 lb 9.6 oz (92.806 kg)  12/28/15 204 lb (92.534 kg)    PHYSICAL EXAMINATION: oral temperature is 98 F (36.7 C). Her blood pressure is 119/94 and her pulse is 74. Her  respiration is 16.   Diffuse erythema with early emerging dry desquamation.  ASSESSMENT: The patient is doing satisfactorily with treatment.  PLAN: We will continue with the patient's radiation treatment as planned.   ------------------------------------------------  Jodelle Gross, MD, PhD  This document serves as a record of services personally performed by Kyung Rudd, MD. It was created on his behalf by Darcus Austin, a trained medical scribe. The creation of this record is based on the scribe's personal observations and the provider's statements to them. This document has been checked and approved by the attending provider.

## 2016-01-16 NOTE — Progress Notes (Signed)
Weekly rad tx  Right breast 26/ compleetd, bright erythema,skin intact, using radiaplex bid, itchy in mid chest and under arm, no other c/o 8:56 AM  BP 119/94 mmHg  Pulse 74  Temp(Src) 98 F (36.7 C) (Oral)  Resp 16  Wt Readings from Last 3 Encounters:  01/11/16 204 lb (92.534 kg)  01/04/16 204 lb 9.6 oz (92.806 kg)  12/28/15 204 lb (92.534 kg)

## 2016-01-17 ENCOUNTER — Ambulatory Visit
Admission: RE | Admit: 2016-01-17 | Discharge: 2016-01-17 | Disposition: A | Discharge status: Home / Self Care | Payer: Managed Care, Other (non HMO) | Source: Ambulatory Visit | Attending: Radiation Oncology | Admitting: Radiation Oncology

## 2016-01-17 DIAGNOSIS — Z510 Encounter for antineoplastic radiation therapy: Secondary | ICD-10-CM | POA: Diagnosis not present

## 2016-01-18 ENCOUNTER — Ambulatory Visit
Admission: RE | Admit: 2016-01-18 | Discharge: 2016-01-18 | Disposition: A | Discharge status: Home / Self Care | Payer: Managed Care, Other (non HMO) | Source: Ambulatory Visit | Attending: Radiation Oncology | Admitting: Radiation Oncology

## 2016-01-18 DIAGNOSIS — Z510 Encounter for antineoplastic radiation therapy: Secondary | ICD-10-CM | POA: Diagnosis not present

## 2016-01-21 ENCOUNTER — Ambulatory Visit
Admission: RE | Admit: 2016-01-21 | Discharge: 2016-01-21 | Disposition: A | Discharge status: Home / Self Care | Payer: Managed Care, Other (non HMO) | Source: Ambulatory Visit | Attending: Radiation Oncology | Admitting: Radiation Oncology

## 2016-01-21 DIAGNOSIS — Z510 Encounter for antineoplastic radiation therapy: Secondary | ICD-10-CM | POA: Diagnosis not present

## 2016-01-22 ENCOUNTER — Ambulatory Visit
Admission: RE | Admit: 2016-01-22 | Discharge: 2016-01-22 | Disposition: A | Discharge status: Home / Self Care | Payer: Managed Care, Other (non HMO) | Source: Ambulatory Visit | Attending: Radiation Oncology | Admitting: Radiation Oncology

## 2016-01-22 DIAGNOSIS — Z510 Encounter for antineoplastic radiation therapy: Secondary | ICD-10-CM | POA: Diagnosis not present

## 2016-01-23 ENCOUNTER — Ambulatory Visit
Admission: RE | Admit: 2016-01-23 | Discharge: 2016-01-23 | Disposition: A | Discharge status: Home / Self Care | Payer: Managed Care, Other (non HMO) | Source: Ambulatory Visit | Attending: Radiation Oncology | Admitting: Radiation Oncology

## 2016-01-23 DIAGNOSIS — Z510 Encounter for antineoplastic radiation therapy: Secondary | ICD-10-CM | POA: Diagnosis not present

## 2016-01-24 ENCOUNTER — Ambulatory Visit: Payer: Managed Care, Other (non HMO)

## 2016-01-24 ENCOUNTER — Ambulatory Visit
Admission: RE | Admit: 2016-01-24 | Discharge: 2016-01-24 | Disposition: A | Discharge status: Home / Self Care | Payer: Managed Care, Other (non HMO) | Source: Ambulatory Visit | Attending: Radiation Oncology | Admitting: Radiation Oncology

## 2016-01-24 DIAGNOSIS — Z510 Encounter for antineoplastic radiation therapy: Secondary | ICD-10-CM | POA: Diagnosis not present

## 2016-01-25 ENCOUNTER — Encounter: Payer: Self-pay | Admitting: Radiation Oncology

## 2016-01-25 ENCOUNTER — Telehealth: Payer: Self-pay | Admitting: *Deleted

## 2016-01-25 ENCOUNTER — Ambulatory Visit
Admission: RE | Admit: 2016-01-25 | Discharge: 2016-01-25 | Disposition: A | Discharge status: Home / Self Care | Payer: Managed Care, Other (non HMO) | Source: Ambulatory Visit | Attending: Radiation Oncology | Admitting: Radiation Oncology

## 2016-01-25 VITALS — BP 139/91 | HR 76 | Temp 98.0°F | Resp 16 | Ht 64.0 in | Wt 205.3 lb

## 2016-01-25 DIAGNOSIS — C50411 Malignant neoplasm of upper-outer quadrant of right female breast: Secondary | ICD-10-CM

## 2016-01-25 DIAGNOSIS — Z510 Encounter for antineoplastic radiation therapy: Secondary | ICD-10-CM | POA: Diagnosis not present

## 2016-01-25 NOTE — Progress Notes (Signed)
  Radiation Oncology         [phone]   Name: Dorothy Gallagher MRN: [id number]   Date: 01/25/2016  DOB: 12-Feb-1971   Weekly Radiation Therapy Management    ICD-9-CM ICD-10-CM   1. Breast cancer of upper-outer quadrant of right female breast ([place]) 174.4 C50.411     Current Dose: 60. Gy  Planned Dose:  60.4 Gy  Narrative The patient presents for routine under treatment assessment.  The patient has completed 33/33 treatments to the right breast. Mild erythema, skin intact, and dryness around the nipple.  Set-up films were reviewed. The chart was checked.  Physical Findings  height is 5\' 4"  (1.626 m) and weight is 205 lb 4.8 oz (93.123 kg). Her oral temperature is 98 F (36.7 C). Her blood pressure is 139/91 and her pulse is 76. Her respiration is 16. . Weight essentially stable. Hyperpigmentation with no desquamation of the right breast.  Impression The patient has tolerated radiation.  Plan The patient will follow up with rad onc in 1 month and a follow up appointment card was given. I advised the patient to practice good sun hygiene.     Sheral Apley Tammi Klippel, M.D.  This document serves as a record of services personally performed by Tyler Pita, MD. It was created on his behalf by Darcus Austin, a trained medical scribe. The creation of this record is based on the scribe's personal observations and the provider's statements to them. This document has been checked and approved by the attending provider.

## 2016-01-25 NOTE — Progress Notes (Signed)
wekly rad txs right breast 33/33 completed, mild erythmea , skin intact, dryness ariound nipple, 1 mo  F/u appt with Carmine Savoy, Pa, no c/o 8:54 AM BP 139/91 mmHg  Pulse 76  Temp(Src) 98 F (36.7 C) (Oral)  Resp 16  Ht 5\' 4"  (1.626 m)  Wt 205 lb 4.8 oz (93.123 kg)  BMI 35.22 kg/m2  Wt Readings from Last 3 Encounters:  01/25/16 205 lb 4.8 oz (93.123 kg)  01/11/16 204 lb (92.534 kg)  01/04/16 204 lb 9.6 oz (92.806 kg)

## 2016-01-25 NOTE — Telephone Encounter (Signed)
  Oncology Nurse Navigator Documentation    Navigator Encounter Type: Telephone (01/25/16 0900) Telephone: Dorothy Gallagher Call (01/25/16 0900)         Patient Visit Type: RadOnc (01/25/16 0900) Treatment Phase: Final Radiation Tx (01/25/16 0900) Barriers/Navigation Needs: No barriers at this time;No Questions;No Needs (01/25/16 0900)  Relate doing well and without needs or questions. Encourage pt to call with questions or needs. Received verbal understanding. Interventions: Referrals (01/25/16 0900) Referrals: Survivorship (01/25/16 0900)          Acuity: Level 1 (01/25/16 0900)         Time Spent with Patient: 15 (01/25/16 0900)

## 2016-01-29 NOTE — Progress Notes (Signed)
Complex simulation note  Diagnosis: Right-sided breast cancer  Narrative The patient has initially been planned to receive a course of whole breast radiation to a dose of 50.4 Gy in 28 fractions. The patient will now receive an additional boost to the seroma cavity which has been contoured. This will correspond to a boost of 10 Gy at 2 Gy per fraction. To accomplish this, an additional 2 customized blocks have been designed for this purpose. A complex isodose plan is requested to ensure that the target area is adequately covered with radiation dose and that the nearby normal structures such as the lung are adequately spared. The patient's final total dose will be 60.4 Gy.  ------------------------------------------------  Dorothy Rogstad [name], MD, PhD   

## 2016-01-29 NOTE — Progress Notes (Signed)
  Radiation Oncology         [phone] ________________________________  Name: Dorothy Gallagher MRN: [id number]  Date: 01/25/2016  DOB: 1971-03-26  End of Treatment Note  Diagnosis:   Right-sided breast cancer     Indication for treatment:  Curative       Radiation treatment dates:   12/11/2015 through 01/25/2016  Site/dose:   The patient initially received a dose of 50.4 Gy in 28 fractions to the breast using whole-breast tangent fields. This was delivered using a 3-D conformal technique. The patient then received a boost to the seroma. This delivered an additional 10 Gy in 5 fractions using a 2 field photon technique. The total dose was 60.4 Gy.  Narrative: The patient tolerated radiation treatment relatively well.   The patient had some expected skin irritation as she progressed during treatment. Moist desquamation was not present at the end of treatment.  Plan: The patient has completed radiation treatment. The patient will return to radiation oncology clinic for routine followup in one month. I advised the patient to call or return sooner if they have any questions or concerns related to their recovery or treatment. ________________________________  Jodelle Gross, M.D., Ph.D.

## 2016-01-31 ENCOUNTER — Telehealth: Payer: Self-pay | Admitting: Hematology and Oncology

## 2016-01-31 NOTE — Telephone Encounter (Signed)
S/w pt, advised 7/3 appt chgd to 7/6 due to md pal. Pt says she will be out of town on 7/6 but should be back by Monday 7/10. Gave appt for 7/10 @ 11am.

## 2016-02-04 ENCOUNTER — Ambulatory Visit: Payer: Managed Care, Other (non HMO) | Admitting: Hematology and Oncology

## 2016-02-07 ENCOUNTER — Ambulatory Visit: Payer: Managed Care, Other (non HMO) | Admitting: Hematology and Oncology

## 2016-02-11 ENCOUNTER — Ambulatory Visit (HOSPITAL_BASED_OUTPATIENT_CLINIC_OR_DEPARTMENT_OTHER): Payer: Managed Care, Other (non HMO) | Admitting: Hematology and Oncology

## 2016-02-11 ENCOUNTER — Encounter: Payer: Self-pay | Admitting: Hematology and Oncology

## 2016-02-11 ENCOUNTER — Telehealth: Payer: Self-pay | Admitting: Hematology and Oncology

## 2016-02-11 VITALS — BP 142/81 | HR 80 | Temp 98.4°F | Resp 18 | Ht 64.0 in | Wt 204.7 lb

## 2016-02-11 DIAGNOSIS — Z79811 Long term (current) use of aromatase inhibitors: Secondary | ICD-10-CM

## 2016-02-11 DIAGNOSIS — C50411 Malignant neoplasm of upper-outer quadrant of right female breast: Secondary | ICD-10-CM | POA: Diagnosis not present

## 2016-02-11 DIAGNOSIS — Z170 Estrogen receptor positive status [ER+]: Secondary | ICD-10-CM | POA: Diagnosis not present

## 2016-02-11 MED ORDER — TAMOXIFEN CITRATE 20 MG PO TABS: 20.0000 mg | ORAL_TABLET | Freq: Every day | ORAL | Status: DC

## 2016-02-11 NOTE — Progress Notes (Signed)
Patient Care Team: Cheri Fowler, MD as PCP - General (Obstetrics and Gynecology) Rolm Bookbinder, MD as Consulting Physician (General Surgery) Nicholas Lose, MD as Consulting Physician (Hematology and Oncology) Kyung Rudd, MD as Consulting Physician (Radiation Oncology) Sylvan Cheese, NP as Nurse Practitioner (Hematology and Oncology)  DIAGNOSIS: Breast cancer of upper-outer quadrant of right female breast [facility])   Staging form: Breast, AJCC 7th Edition     Clinical stage from 10/10/2015: Stage IA (T1c, N0, M0) - Unsigned       Staging comments: Staged at breast conference on 3.8.17   Follow-up he is is worse on SUMMARY OF ONCOLOGIC HISTORY:   Breast cancer of upper-outer quadrant of right female breast ([place])   10/05/2015 Initial Diagnosis Right breast mass screening detected 10:00: 1.4 x 1.2 x 0.9 cm, ultrasound biopsy: Grade 2 invasive ductal carcinoma with DCIS ER/PR positive HER-2 negative Ki-67 10%, T1 cN0 stage IA clinical stage   10/31/2015 Surgery Right lumpectomy: Invasive ductal carcinoma, grade 2, 1.5 cm, with DCIS, margins negative, 0/4 lymph nodes negative, T1 cN0 stage IA pathological stage, ER 90%, PR 90%, HER-2 negative, Ki-67 10%, Oncotype DX score 13, 9% ROR   12/11/2015 - 01/25/2016 Radiation Therapy Adjuvant radiation therapy    CHIEF COMPLIANT: Follow-up after radiation therapy  INTERVAL HISTORY: Dorothy Gallagher is a 60 over the above-mentioned history of right breast cancer treated with lumpectomy and radiation and is here to discuss antiestrogen therapy options. She had done fairly well from radiation. Denies any pain or discomfort or fatigue related to radiation therapy.  REVIEW OF SYSTEMS:   Constitutional: Denies fevers, chills or abnormal weight loss Eyes: Denies blurriness of vision Ears, nose, mouth, throat, and face: Denies mucositis or sore throat Respiratory: Denies cough, dyspnea or wheezes Cardiovascular: Denies palpitation, chest  discomfort Gastrointestinal:  Denies nausea, heartburn or change in bowel habits Skin: Denies abnormal skin rashes Lymphatics: Denies new lymphadenopathy or easy bruising Neurological:Denies numbness, tingling or new weaknesses Behavioral/Psych: Mood is stable, no new changes  Extremities: No lower extremity edema Breast: Recent radiation All other systems were reviewed with the patient and are negative.  I have reviewed the past medical history, past surgical history, social history and family history with the patient and they are unchanged from previous note.  ALLERGIES:  has No Known Allergies.  MEDICATIONS:  Current Outpatient Prescriptions  Medication Sig Dispense Refill  . non-metallic deodorant (ALRA) MISC Apply 1 application topically daily as needed.    . ranitidine (ZANTAC) 150 MG tablet Take 150 mg by mouth 2 (two) times daily.    . tamoxifen (NOLVADEX) 20 MG tablet Take 1 tablet (20 mg total) by mouth daily. 90 tablet 3   No current facility-administered medications for this visit.    PHYSICAL EXAMINATION: ECOG PERFORMANCE STATUS: 1 - Symptomatic but completely ambulatory  Filed Vitals:   02/11/16 1039  BP: 142/81  Pulse: 80  Temp: 98.4 F (36.9 C)  Resp: 18   Filed Weights   02/11/16 1039  Weight: 204 lb 11.2 oz (92.851 kg)    GENERAL:alert, no distress and comfortable SKIN: skin color, texture, turgor are normal, no rashes or significant lesions EYES: normal, Conjunctiva are pink and non-injected, sclera clear OROPHARYNX:no exudate, no erythema and lips, buccal mucosa, and tongue normal  NECK: supple, thyroid normal size, non-tender, without nodularity LYMPH:  no palpable lymphadenopathy in the cervical, axillary or inguinal LUNGS: clear to auscultation and percussion with normal breathing effort HEART: regular rate & rhythm and no murmurs  and no lower extremity edema ABDOMEN:abdomen soft, non-tender and normal bowel sounds MUSCULOSKELETAL:no cyanosis of  digits and no clubbing  NEURO: alert & oriented x 3 with fluent speech, no focal motor/sensory deficits EXTREMITIES: No lower extremity edema   LABORATORY DATA:  I have reviewed the data as listed   Chemistry      Component Value Date/Time   NA 141 10/10/2015 1226   K 4.2 10/10/2015 1226   CO2 25 10/10/2015 1226   BUN 12.4 10/10/2015 1226   CREATININE 0.8 10/10/2015 1226      Component Value Date/Time   CALCIUM 9.6 10/10/2015 1226   ALKPHOS 68 10/10/2015 1226   AST 14 10/10/2015 1226   ALT 13 10/10/2015 1226   BILITOT <0.30 10/10/2015 1226       Lab Results  Component Value Date   WBC 7.3 10/10/2015   HGB 12.6 10/10/2015   HCT 37.2 10/10/2015   MCV 85.7 10/10/2015   PLT 304 10/10/2015   NEUTROABS 4.2 10/10/2015     ASSESSMENT & PLAN:  Breast cancer of upper-outer quadrant of right female breast ([place]) Right lumpectomy 10/31/2015: Invasive ductal carcinoma, grade 2, 1.5 cm, with DCIS, margins negative, 0/4 lymph nodes negative, T1 cN0 stage IA pathological stage, ER 90%, PR 90%, HER-2 negative, Ki-67 10%, Oncotype DX score 13, 9% risk of recurrence, low risk Adjuvant radiation therapy from 12/11/2015 to 01/25/2016   Recommendation: Adjuvant antiestrogen therapy with tamoxifen 20 mg daily Tamoxifen counseling:We discussed the risks and benefits of tamoxifen. These include but not limited to insomnia, hot flashes, mood changes, vaginal dryness, and weight gain. Although rare, serious side effects including endometrial cancer, risk of blood clots were also discussed. We strongly believe that the benefits far outweigh the risks. Patient understands these risks and consented to starting treatment. Planned treatment duration is 5 years.  Return to clinic in 3 months for toxicity check and follow-up  No orders of the defined types were placed in this encounter.   The patient has a good understanding of the overall plan. she agrees with it. she will call with any problems that  may develop before the next visit here.   Rulon Eisenmenger, MD 02/11/2016

## 2016-02-11 NOTE — Telephone Encounter (Signed)
appt made and avs printed °

## 2016-02-11 NOTE — Assessment & Plan Note (Signed)
Right lumpectomy 10/31/2015: Invasive ductal carcinoma, grade 2, 1.5 cm, with DCIS, margins negative, 0/4 lymph nodes negative, T1 cN0 stage IA pathological stage, ER 90%, PR 90%, HER-2 negative, Ki-67 10%, Oncotype DX score 13, 9% risk of recurrence, low risk Adjuvant radiation therapy from 12/11/2015 to 01/25/2016   Recommendation: Adjuvant antiestrogen therapy with tamoxifen 20 mg daily Tamoxifen counseling:We discussed the risks and benefits of tamoxifen. These include but not limited to insomnia, hot flashes, mood changes, vaginal dryness, and weight gain. Although rare, serious side effects including endometrial cancer, risk of blood clots were also discussed. We strongly believe that the benefits far outweigh the risks. Patient understands these risks and consented to starting treatment. Planned treatment duration is 5 years.  Return to clinic in 3 months for toxicity check and follow-up

## 2016-02-22 ENCOUNTER — Other Ambulatory Visit: Payer: Self-pay | Admitting: *Deleted

## 2016-02-22 DIAGNOSIS — C50411 Malignant neoplasm of upper-outer quadrant of right female breast: Secondary | ICD-10-CM

## 2016-02-23 ENCOUNTER — Telehealth: Payer: Self-pay | Admitting: Hematology and Oncology

## 2016-02-23 NOTE — Telephone Encounter (Signed)
S/w pt, gave appt for 9/27 @ 11.30am.

## 2016-02-26 ENCOUNTER — Ambulatory Visit
Admission: RE | Admit: 2016-02-26 | Discharge: 2016-02-26 | Disposition: A | Discharge status: Home / Self Care | Payer: Managed Care, Other (non HMO) | Source: Ambulatory Visit | Attending: Radiation Oncology | Admitting: Radiation Oncology

## 2016-02-26 VITALS — BP 127/67 | HR 65 | Temp 97.7°F | Resp 18 | Ht 64.0 in | Wt 203.0 lb

## 2016-02-26 DIAGNOSIS — Z170 Estrogen receptor positive status [ER+]: Secondary | ICD-10-CM | POA: Diagnosis not present

## 2016-02-26 DIAGNOSIS — N200 Calculus of kidney: Secondary | ICD-10-CM | POA: Diagnosis not present

## 2016-02-26 DIAGNOSIS — C50411 Malignant neoplasm of upper-outer quadrant of right female breast: Secondary | ICD-10-CM

## 2016-02-26 DIAGNOSIS — C50911 Malignant neoplasm of unspecified site of right female breast: Secondary | ICD-10-CM | POA: Insufficient documentation

## 2016-02-26 DIAGNOSIS — Z79899 Other long term (current) drug therapy: Secondary | ICD-10-CM | POA: Insufficient documentation

## 2016-02-26 NOTE — Progress Notes (Addendum)
Mrs. Stephane Cristea  is here for a one month  follow up visit for breast cancer to right breast.  Skin status: Normal pink color to right breast. Lotion being used: Using Radiaplex gel to right breast at times Have you seen your medical oncologist? Date If not ,when is appointment 05-13-16 Dr. Lindi Adie When is yiur next mammogram? Has it been scheduled ? : Not scheduled yet ER+,have started AI or Tamoxifen? If not, why?  02-08-16 started Tamoxifen  Discuss survivorship appointment: 04-30-16 Deneen Harts Offer referral reading material for Survivorship, [facility] and [facility] given 02-26-16 Appetite: Good Pain: None Arm mobility:Able to raise without difficulty Fatigue:No .    BP 127/67   Pulse 65   Temp 97.7 F (36.5 C) (Oral)   Resp 18   Ht 5\' 4"  (1.626 m)   Wt 203 lb (92.1 kg)   LMP 11/03/2015   SpO2 100%   BMI 34.84 kg/m

## 2016-02-26 NOTE — Progress Notes (Signed)
Radiation Oncology         ([phone]) [phone] ________________________________  Name: Dorothy Gallagher MRN: [id number]  Date: 02/26/2016  DOB: 03-08-1971  Follow-Up Visit Note  CC: Clarene Duke, MD  Rolm Bookbinder, MD  Diagnosis:   Stage IA, T1c, N0, M0, ER/PR positive invasive ductal carcinoma with DCIS of the right breast.  Interval Since Last Radiation: 4 weeks   12/11/2015 through 01/25/2016: The patient initially received a dose of 50.4 Gy in 28 fractions to the breast using whole-breast tangent fields. This was delivered using a 3-D conformal technique. The patient then received a boost to the seroma. This delivered an additional 10 Gy in 5 fractions using a 2 field photon technique. The total dose was 60.4 Gy.  Narrative:  The patient returns today for routine follow-up.  She did well during her course of radiotherapy she did not have any difficulty with significant desquamation at the end of treatment. She has met with Dr. Lindi Adie and has started tamoxifen. Of note, on the patient's return from vacation she was diagnosed as having a kidney stone and was seen in the emergency department. She has since been seen by Dr. Louis Meckel, and is using Flomax.                             On review of systems, the patient states she is doing pretty well overall. She is tolerating her Flomax. She denies any difficulty currently with abdominal or flank pain. She denies any nausea vomiting, chest pain, shortness of breath, she denies any skin changes or concerns.no other complaints or verbalized.   ALLERGIES:  has No Known Allergies.  Meds: Current Outpatient Prescriptions  Medication Sig Dispense Refill  . ranitidine (ZANTAC) 150 MG tablet Take 150 mg by mouth 2 (two) times daily.    . tamoxifen (NOLVADEX) 20 MG tablet Take 1 tablet (20 mg total) by mouth daily. 90 tablet 3  . tamsulosin (FLOMAX) 0.4 MG CAPS capsule Take 0.4 mg by mouth daily.     No current facility-administered medications for  this encounter.     Physical Findings:  height is 5' 4"  (1.626 m) and weight is 203 lb (92.1 kg). Her oral temperature is 97.7 F (36.5 C). Her blood pressure is 127/67 and her pulse is 65. Her respiration is 18 and oxygen saturation is 100%.  In general this is a well appearing Caucasian female in no acute distress. She's alert and oriented x4 and appropriate throughout the examination. Cardiopulmonary assessment is negative for acute distress and she exhibits normal effort. Her right breast is assessed and reveals no evidence of desquamation. No evidence of hyper pigmentation is noted.    Lab Findings: Lab Results  Component Value Date   WBC 7.3 10/10/2015   HGB 12.6 10/10/2015   HCT 37.2 10/10/2015   MCV 85.7 10/10/2015   PLT 304 10/10/2015     Radiographic Findings: No results found.  Impression/Plan: 1. Stage IA, T1c, N0, M0, ER/PR positive invasive ductal carcinoma with DCIS of the right breast. The patient has done well and tolerating her radiotherapy. We discussed the rationale for continued follow-up with Dr. Lindi Adie, and with be happy to see her back if she has questions or concerns regarding her previous radiation treatments. She will continueestrogen blockade with tamoxifen. 2. Survivorship. The patient will be seen by Mike Craze, NP and the survivor's clinic in September. We discussed the rationale for this visit, and have encouraged  patient to attend. 3. Kidney stone. The patient will continue to follow up with Dr. Louis Meckel outlines urologyfor management of this.     Carola Rhine, PAC

## 2016-04-30 ENCOUNTER — Ambulatory Visit (HOSPITAL_BASED_OUTPATIENT_CLINIC_OR_DEPARTMENT_OTHER): Payer: Managed Care, Other (non HMO) | Admitting: Adult Health

## 2016-04-30 VITALS — BP 127/89 | HR 76 | Temp 98.2°F | Resp 18 | Wt 206.5 lb

## 2016-04-30 DIAGNOSIS — C50411 Malignant neoplasm of upper-outer quadrant of right female breast: Secondary | ICD-10-CM

## 2016-04-30 DIAGNOSIS — Z23 Encounter for immunization: Secondary | ICD-10-CM | POA: Diagnosis not present

## 2016-04-30 MED ORDER — INFLUENZA VAC SPLIT QUAD 0.5 ML IM SUSY
0.5000 mL | PREFILLED_SYRINGE | Freq: Once | INTRAMUSCULAR | Status: AC
  Administered 2016-04-30: 0.5 mL via INTRAMUSCULAR
  Filled 2016-04-30: qty 0.5

## 2016-04-30 NOTE — Progress Notes (Signed)
CLINIC:  Survivorship   REASON FOR VISIT:  Routine follow-up post-treatment for a recent history of breast cancer.  BRIEF ONCOLOGIC HISTORY:    Breast cancer of upper-outer quadrant of right female breast ([place])   10/05/2015 Initial Diagnosis    Right breast mass screening detected 10:00: 1.4 x 1.2 x 0.9 cm, ultrasound biopsy: Grade 2 invasive ductal carcinoma with DCIS ER/PR positive HER-2 negative Ki-67 10%, T1 cN0 stage IA clinical stage      10/11/2015 Procedure    Genetic testing: Negative. Genes evaluated: APC, ATM, AXIN2, BARD1, BMPR1A, BRCA1, BRCA2, BRIP1, CDH1, CDKN2A, CHEK2, DICER1, EPCAM, GREM1, KIT, MEN1, MLH1, MSH2, MSH6, MUTYH, NBN, NF1, PALB2, PDGFRA, PMS2, POLD1, POLE, PTEN, RAD50, RAD51C, RAD51D, SDHA, SDHB, SDHC, SDHD, SMAD4, SMARCA4, STK11, TP53, TSC1, TSC2, and VHL      10/22/2015 Oncotype testing    Recurrence score: 13; 9% ROR (low-risk)      10/31/2015 Surgery    Right lumpectomy Donne Hazel): Invasive ductal carcinoma, grade 2, 1.5 cm, with DCIS, margins negative, 0/4 lymph nodes negative, T1 cN0 stage IA pathological stage, ER 90%, PR 90%, HER-2 negative, Ki-67 10%, Oncotype DX score 13, 9% ROR      12/11/2015 - 01/25/2016 Radiation Therapy    Adjuvant radiation therapy Lisbeth Renshaw). Right breast: 50.4 Gy in 28 fractions. Right breast boost: 10 Gy in 5 fractions.       02/2016 -  Anti-estrogen oral therapy    Tamoxifen 20 mg daily. Planned duration of treatment: 5 years        INTERVAL HISTORY:  Ms. Matthew presents to the [facility] today for our initial meeting to review her survivorship care plan detailing her treatment course for breast cancer, as well as monitoring long-term side effects of that treatment, education regarding health maintenance, screening, and overall wellness and health promotion.     Overall, Ms. Gallen reports feeling quite well since completing her radiation therapy approximately 3 months ago.  She started the Tamoxifen in 02/2016 and  is tolerating it well; she has occasional hot flashes, but they are not severe.  She has some vaginal discharge, but again this is not worrisome for her.  Denies abnormal vaginal bleeding.   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  She has elected not to return to work since having cancer and now does not work outside of the home.     REVIEW OF SYSTEMS:  Review of Systems  Constitutional: Negative.   HENT: Negative.   Eyes: Negative.   Respiratory: Negative.   Cardiovascular: Negative.   Gastrointestinal: Negative.   Genitourinary: Negative.   Musculoskeletal: Negative.   Skin: Negative.   Neurological: Negative.   Endo/Heme/Allergies:       Occasional hot flashes  Psychiatric/Behavioral: Negative.   GU: Denies vaginal bleeding or dryness.  Breast: Denies any new nodularity, masses, tenderness, nipple changes, or nipple discharge.    A 14-point review of systems was completed and was negative, except as noted above.   ONCOLOGY TREATMENT TEAM:  1. Surgeon:  Dr. Donne [facility]el at [facility] 2. Medical Oncologist: Dr. Lindi Adie  3. Radiation Oncologist: Dr. Lisbeth Renshaw    PAST MEDICAL/SURGICAL HISTORY:  Past Medical History:  Diagnosis Date  . Breast cancer ([place])   . Breast cancer of upper-outer quadrant of right female breast ([place]) 10/04/2015  . GERD (gastroesophageal reflux disease)   . H/O colonoscopy 2016  . Hearing loss    right ear  . Snores    Past  Surgical History:  Procedure Laterality Date  . CHOLECYSTECTOMY    . ESSURE TUBAL LIGATION    . RADIOACTIVE SEED GUIDED MASTECTOMY WITH AXILLARY SENTINEL LYMPH NODE BIOPSY Right 10/31/2015   Procedure: RADIOACTIVE SEED GUIDED RIGHT PARTIAL MASTECTOMY WITH RIGHT AXILLARY SENTINEL LYMPH NODE BIOPSY;  Surgeon: Rolm Bookbinder, MD;  Location: [facility];  Service: General;  Laterality: Right;  . WISDOM TOOTH EXTRACTION       ALLERGIES:  No Known Allergies   CURRENT  MEDICATIONS:  Outpatient Encounter Prescriptions as of 04/30/2016  Medication Sig  . ranitidine (ZANTAC) 150 MG tablet Take 150 mg by mouth 2 (two) times daily.  . tamoxifen (NOLVADEX) 20 MG tablet Take 1 tablet (20 mg total) by mouth daily.  . [DISCONTINUED] tamsulosin (FLOMAX) 0.4 MG CAPS capsule Take 0.4 mg by mouth daily.   No facility-administered encounter medications on file as of 04/30/2016.      ONCOLOGIC FAMILY HISTORY:  Family History  Problem Relation Age of Onset  . Breast cancer Mother 59    w/ metastasis  . Other Mother     hx of partial hysterectomy; hx of ovarian cysts  . Lung cancer Father 48    former smoker  . Colon cancer Maternal Grandmother     dx. 43s  . Fibrocystic breast disease Sister   . Colon polyps Sister     approx 1-3  . Colon cancer Maternal Uncle   . Colon polyps Sister     1-3  . Fibrocystic breast disease Sister     cysts "enlarge and go down"  . Cancer Maternal Aunt     NOS; d. 45s  . Cancer Maternal Uncle     blood, liver, or pancreatic cancer; dx. late 60s-early 70s  . Lung cancer Maternal Uncle     smoker; d. late 36s  . Leukemia Cousin     maternal 1st cousin dx. late 31s  . Breast cancer Cousin     s/p lumpectomy; dx. late 62s or earlier  . Thyroid cancer Cousin 74    maternal 1st cousin; s/p thyroidectomy  . Other Paternal Grandmother     hx of "benign 40-lb stomach tumor" that was removed  . Heart attack Paternal Grandfather 92  . Cancer Cousin 21    "knot on leg" that metastasized to bone cancer     GENETIC COUNSELING/TESTING: 10/11/15-Genetic testing: Negative. Genes evaluated: APC, ATM, AXIN2, BARD1, BMPR1A, BRCA1, BRCA2, BRIP1, CDH1, CDKN2A, CHEK2, DICER1, EPCAM, GREM1, KIT, MEN1, MLH1, MSH2, MSH6, MUTYH, NBN, NF1, PALB2, PDGFRA, PMS2, POLD1, POLE, PTEN, RAD50, RAD51C, RAD51D, SDHA, SDHB, SDHC, SDHD, SMAD4, SMARCA4, STK11, TP53, TSC1, TSC2, and VHL   SOCIAL HISTORY:  CHALEE HIROTA is married and lives with her husband  in [place], [place].  She has 3 children and 2 grandchildren.   She previously worked as a Art therapist.  She now does not work outside of the home.  She denies any current or history of tobacco or illicit drug use.  She drinks alcohol occasionally.    PHYSICAL EXAMINATION:  Vital Signs:   Vitals:   04/30/16 1117  BP: 127/89  Pulse: 76  Resp: 18  Temp: 98.2 F (36.8 C)   Filed Weights   04/30/16 1117  Weight: 206 lb 8 oz (93.7 kg)   General: Well-nourished, well-appearing female in no acute distress.  She is unaccompanied today.   HEENT: Head is normocephalic.  Pupils equal and reactive to light. Conjunctivae clear without exudate.  Sclerae anicteric. Oral  mucosa is pink, moist.  Oropharynx is pink without lesions or erythema.  Lymph: No cervical, supraclavicular, or infraclavicular lymphadenopathy noted on palpation.  Cardiovascular: Regular rate and rhythm.Marland Kitchen Respiratory: Clear to auscultation bilaterally. Chest expansion symmetric; breathing non-labored.  GI: Abdomen soft and round; non-tender, non-distended. Bowel sounds normoactive.  GU: Deferred.  Neuro: No focal deficits. Steady gait.  Psych: Mood and affect normal and appropriate for situation.  Extremities: No edema. Skin: Warm and dry.  LABORATORY DATA:  None for this visit.  DIAGNOSTIC IMAGING:  None for this visit.      ASSESSMENT AND PLAN:  Ms.. Prichett is a pleasant 45 y.o. female with Stage IA right breast invasive ductal carcinoma, ER+/PR+/HER2-, diagnosed in 10/2015; treated with lumpectomy, adjuvant radiation therapy, and anti-estrogen therapy with Tamoxifen beginning in 02/3016.  She presents to the Survivorship Clinic for our initial meeting and routine follow-up post-completion of treatment for breast cancer.    1. Stage IA right breast cancer:  Ms. Thier is continuing to recover from definitive treatment for breast cancer. She will follow-up with her medical oncologist, Dr. Lindi Adie in 05/2016 with history  and physical exam per surveillance protocol.  She will continue her anti-estrogen therapy with Tamoxifen. Thus far, she is tolerating the medication well, with minimal side effects. She was instructed to make Dr. Lindi Adie or myself aware if she begins to experience any worsening side effects of the medication and I could see her back in clinic to help manage those side effects, as needed. Though the incidence is low, there is an associated risk of endometrial cancer with anti-estrogen therapies like Tamoxifen.  Ms. Gomm was encouraged to contact Dr. Lindi Adie or myself with any abnormal vaginal bleeding while taking Tamoxifen. Common side effects of Tamoxifen were again reviewed with her as well. Today, a comprehensive survivorship care plan and treatment summary was reviewed with the patient today detailing her breast cancer diagnosis, treatment course, potential late/long-term effects of treatment, appropriate follow-up care with recommendations for the future, and patient education resources.  A copy of this summary, along with a letter will be sent to the patient's primary care provider via mail/fax/In Basket message after today's visit.    2. Hot flashes secondary to tamoxifen: Her hot flashes are described as mild and not too bothersome for her at this time. We discussed that she could take Vitamin E capsules OTC, as vitamin E has been shown to help reduce hot flashes in women during menopause.  Encouraged her to let us know if her symptoms worsen as well.    3. Annual flu vaccine: Annual flu vaccine given today in clinic by RN per patient wishes.    4. Bone health:  Given Ms. Messler's history of breast cancer, she is at risk for bone demineralization.  She has not had a DEXA scan; we discussed that Tamoxifen often offers the positive side effect of increased bone density when compared to aromatase inhibitors.  I will defer any future DEXA scans to her medical oncologist or PCP, as clinically indicated.  In  the meantime, she was encouraged to increase her consumption of foods rich in calcium, as well as increase her weight-bearing activities.  She was given education on specific activities to promote bone health.  5. Cancer screening:  Due to Ms. Ottey's history and her age, she should receive screening for skin cancers, colon cancer, and gynecologic cancers.  The information and recommendations are listed on the patient's comprehensive care plan/treatment summary and were reviewed in detail with  the patient.    6. Health maintenance and wellness promotion: Ms. Sevcik was encouraged to consume 5-7 servings of fruits and vegetables per day. We reviewed the "Nutrition Rainbow" handout.  She was also encouraged to engage in moderate to vigorous exercise for 30 minutes per day most days of the week. We discussed the [employer] fitness program, which is designed for cancer survivors to help them become more physically fit after cancer treatments.  She was instructed to limit her alcohol consumption and continue to abstain from tobacco use.    7. Support services/counseling: It is not uncommon for this period of the patient's cancer care trajectory to be one of many emotions and stressors.  We discussed an opportunity for her to participate in the next session of [facility] ("Finding Your New Normal") support group series designed for patients after they have completed treatment.   Ms. Purdum was encouraged to take advantage of our many other support services programs, support groups, and/or counseling in coping with her new life as a cancer survivor after completing anti-cancer treatment.  She was offered support today through active listening and expressive supportive counseling.  She was given information regarding our available services and encouraged to contact me with any questions or for help enrolling in any of our support group/programs.    Dispo:   -Return to cancer center to see Dr. Lindi Adie in 05/2016.     -She is welcome to return back to the Survivorship Clinic at any time; no additional follow-up needed at this time.  -Consider referral back to survivorship as a long-term survivor for continued surveillance  A total of 45 minutes of face-to-face time was spent with this patient with greater than 50% of that time in counseling and care-coordination.   Mike Craze, NP [place] [phone]   Note: PRIMARY CARE PROVIDER Clarene Duke, MD [phone]

## 2016-05-01 ENCOUNTER — Encounter: Payer: Self-pay | Admitting: Adult Health

## 2016-05-13 ENCOUNTER — Encounter: Payer: Self-pay | Admitting: Hematology and Oncology

## 2016-05-13 ENCOUNTER — Ambulatory Visit (HOSPITAL_BASED_OUTPATIENT_CLINIC_OR_DEPARTMENT_OTHER): Payer: Managed Care, Other (non HMO) | Admitting: Hematology and Oncology

## 2016-05-13 DIAGNOSIS — Z79811 Long term (current) use of aromatase inhibitors: Secondary | ICD-10-CM

## 2016-05-13 DIAGNOSIS — Z170 Estrogen receptor positive status [ER+]: Secondary | ICD-10-CM

## 2016-05-13 DIAGNOSIS — C50411 Malignant neoplasm of upper-outer quadrant of right female breast: Secondary | ICD-10-CM

## 2016-05-13 NOTE — Progress Notes (Signed)
Patient Care Team: Cheri Fowler, MD as PCP - General (Obstetrics and Gynecology) Rolm Bookbinder, MD as Consulting Physician (General Surgery) Nicholas Lose, MD as Consulting Physician (Hematology and Oncology) Kyung Rudd, MD as Consulting Physician (Radiation Oncology) Sylvan Cheese, NP as Nurse Practitioner (Hematology and Oncology)  DIAGNOSIS: Breast cancer of upper-outer quadrant of right female breast [facility])   Staging form: Breast, AJCC 7th Edition   - Clinical stage from 10/10/2015: Stage IA (T1c, N0, M0) - Unsigned         Staging comments: Staged at breast conference on 3.8.17   - Pathologic: Stage IA (T1c, N0, cM0) - Signed by Holley Bouche, NP on 04/28/2016  SUMMARY OF ONCOLOGIC HISTORY:   Breast cancer of upper-outer quadrant of right female breast ([place])   10/05/2015 Initial Diagnosis    Right breast mass screening detected 10:00: 1.4 x 1.2 x 0.9 cm, ultrasound biopsy: Grade 2 invasive ductal carcinoma with DCIS ER/PR positive HER-2 negative Ki-67 10%, T1 cN0 stage IA clinical stage      10/11/2015 Procedure    Genetic testing: Negative. Genes evaluated: APC, ATM, AXIN2, BARD1, BMPR1A, BRCA1, BRCA2, BRIP1, CDH1, CDKN2A, CHEK2, DICER1, EPCAM, GREM1, KIT, MEN1, MLH1, MSH2, MSH6, MUTYH, NBN, NF1, PALB2, PDGFRA, PMS2, POLD1, POLE, PTEN, RAD50, RAD51C, RAD51D, SDHA, SDHB, SDHC, SDHD, SMAD4, SMARCA4, STK11, TP53, TSC1, TSC2, and VHL      10/22/2015 Oncotype testing    Recurrence score: 13; 9% ROR (low-risk)      10/31/2015 Surgery    Right lumpectomy Donne Hazel): Invasive ductal carcinoma, grade 2, 1.5 cm, with DCIS, margins negative, 0/4 lymph nodes negative, T1 cN0 stage IA pathological stage, ER 90%, PR 90%, HER-2 negative, Ki-67 10%, Oncotype DX score 13, 9% ROR      12/11/2015 - 01/25/2016 Radiation Therapy    Adjuvant radiation therapy Lisbeth Renshaw). Right breast: 50.4 Gy in 28 fractions. Right breast boost: 10 Gy in 5 fractions.       02/2016 -  Anti-estrogen oral  therapy    Tamoxifen 20 mg daily. Planned duration of treatment: 5 years        CHIEF COMPLIANT: follow-up on tamoxifen  INTERVAL HISTORY: Dorothy Gallagher is a 45 year old with above-mentioned history right breast cancer treated with lumpectomy radiation is currently on tamoxifen therapy. She is tolerating tamoxifen extremely well. She has very occasional hot flashes. Denies any myalgias. Denies any lumps or nodules in the breast. She is healed very well from the prior surgery and radiation.  REVIEW OF SYSTEMS:   Constitutional: Denies fevers, chills or abnormal weight loss Eyes: Denies blurriness of vision Ears, nose, mouth, throat, and face: Denies mucositis or sore throat Respiratory: Denies cough, dyspnea or wheezes Cardiovascular: Denies palpitation, chest discomfort Gastrointestinal:  Denies nausea, heartburn or change in bowel habits Skin: Denies abnormal skin rashes Lymphatics: Denies new lymphadenopathy or easy bruising Neurological:Denies numbness, tingling or new weaknesses Behavioral/Psych: Mood is stable, no new changes  Extremities: No lower extremity edema Breast:  denies any pain or lumps or nodules in either breasts All other systems were reviewed with the patient and are negative.  I have reviewed the past medical history, past surgical history, social history and family history with the patient and they are unchanged from previous note.  ALLERGIES:  has No Known Allergies.  MEDICATIONS:  Current Outpatient Prescriptions  Medication Sig Dispense Refill  . ranitidine (ZANTAC) 150 MG tablet Take 150 mg by mouth 2 (two) times daily.    . tamoxifen (NOLVADEX) 20 MG tablet  Take 1 tablet (20 mg total) by mouth daily. 90 tablet 3   No current facility-administered medications for this visit.     PHYSICAL EXAMINATION: ECOG PERFORMANCE STATUS: 1 - Symptomatic but completely ambulatory  Vitals:   05/13/16 0833  BP: 133/77  Pulse: 69  Resp: 18  Temp: 97.7 F (36.5  C)   Filed Weights   05/13/16 0833  Weight: 205 lb 14.4 oz (93.4 kg)    GENERAL:alert, no distress and comfortable SKIN: skin color, texture, turgor are normal, no rashes or significant lesions EYES: normal, Conjunctiva are pink and non-injected, sclera clear OROPHARYNX:no exudate, no erythema and lips, buccal mucosa, and tongue normal  NECK: supple, thyroid normal size, non-tender, without nodularity LYMPH:  no palpable lymphadenopathy in the cervical, axillary or inguinal LUNGS: clear to auscultation and percussion with normal breathing effort HEART: regular rate & rhythm and no murmurs and no lower extremity edema ABDOMEN:abdomen soft, non-tender and normal bowel sounds MUSCULOSKELETAL:no cyanosis of digits and no clubbing  NEURO: alert & oriented x 3 with fluent speech, no focal motor/sensory deficits EXTREMITIES: No lower extremity edema BREAST: No palpable masses or nodules in either right or left breasts. No palpable axillary supraclavicular or infraclavicular adenopathy no breast tenderness or nipple discharge. (exam performed in the presence of a chaperone)  LABORATORY DATA:  I have reviewed the data as listed   Chemistry      Component Value Date/Time   NA 141 10/10/2015 1226   K 4.2 10/10/2015 1226   CO2 25 10/10/2015 1226   BUN 12.4 10/10/2015 1226   CREATININE 0.8 10/10/2015 1226      Component Value Date/Time   CALCIUM 9.6 10/10/2015 1226   ALKPHOS 68 10/10/2015 1226   AST 14 10/10/2015 1226   ALT 13 10/10/2015 1226   BILITOT <0.30 10/10/2015 1226       Lab Results  Component Value Date   WBC 7.3 10/10/2015   HGB 12.6 10/10/2015   HCT 37.2 10/10/2015   MCV 85.7 10/10/2015   PLT 304 10/10/2015   NEUTROABS 4.2 10/10/2015     ASSESSMENT & PLAN:  Breast cancer of upper-outer quadrant of right female breast ([place]) Right lumpectomy 10/31/2015: Invasive ductal carcinoma, grade 2, 1.5 cm, with DCIS, margins negative, 0/4 lymph nodes negative, T1 cN0 stage  IA pathological stage, ER 90%, PR 90%, HER-2 negative, Ki-67 10%, Oncotype DX score 13, 9% risk of recurrence, low risk Adjuvant radiation therapy from 12/11/2015 to 01/25/2016   Recommendation: Adjuvant antiestrogen therapy with tamoxifen 20 mg daily started 02/11/2016 Tamoxifen toxicities: 1. Occasional hot flashes: Then not bothering her. Denies any myalgias or arthralgias. Patient will see her gynecologist once a year.  Survivorship: I encouraged her to exercise regularly and eat more fruits and vegetables and less red meat. She was provided information regarding the [employer] live [employer] program.  Return to clinic in 6 months for follow-up   No orders of the defined types were placed in this encounter.  The patient has a good understanding of the overall plan. she agrees with it. she will call with any problems that may develop before the next visit here.   Rulon Eisenmenger, MD 05/13/16

## 2016-05-13 NOTE — Assessment & Plan Note (Signed)
Right lumpectomy 10/31/2015: Invasive ductal carcinoma, grade 2, 1.5 cm, with DCIS, margins negative, 0/4 lymph nodes negative, T1 cN0 stage IA pathological stage, ER 90%, PR 90%, HER-2 negative, Ki-67 10%, Oncotype DX score 13, 9% risk of recurrence, low risk Adjuvant radiation therapy from 12/11/2015 to 01/25/2016   Recommendation: Adjuvant antiestrogen therapy with tamoxifen 20 mg daily started 02/11/2016 Tamoxifen toxicities:   Return to clinic in 6 months for follow-up 

## 2016-09-04 ENCOUNTER — Telehealth: Payer: Self-pay | Admitting: Genetic Counselor

## 2016-09-04 NOTE — Telephone Encounter (Signed)
Called patient to let her know her OOP was going to be $100.  Her mailbox is full and I cannot leave a message.

## 2016-10-12 ENCOUNTER — Telehealth: Payer: Self-pay

## 2016-10-12 NOTE — Telephone Encounter (Signed)
Spoke with patient and she is aware of her new appt  Dorothy Gallagher

## 2016-10-14 ENCOUNTER — Other Ambulatory Visit: Payer: Self-pay | Admitting: Hematology and Oncology

## 2016-10-14 DIAGNOSIS — Z853 Personal history of malignant neoplasm of breast: Secondary | ICD-10-CM

## 2016-10-20 ENCOUNTER — Ambulatory Visit
Admission: RE | Admit: 2016-10-20 | Discharge: 2016-10-20 | Disposition: A | Discharge status: Home / Self Care | Payer: Managed Care, Other (non HMO) | Source: Ambulatory Visit | Attending: Hematology and Oncology | Admitting: Hematology and Oncology

## 2016-10-20 DIAGNOSIS — Z853 Personal history of malignant neoplasm of breast: Secondary | ICD-10-CM

## 2016-11-11 ENCOUNTER — Ambulatory Visit: Payer: Managed Care, Other (non HMO) | Admitting: Hematology and Oncology

## 2016-11-20 ENCOUNTER — Encounter: Payer: Self-pay | Admitting: Hematology and Oncology

## 2016-11-20 ENCOUNTER — Ambulatory Visit (HOSPITAL_BASED_OUTPATIENT_CLINIC_OR_DEPARTMENT_OTHER): Payer: Managed Care, Other (non HMO) | Admitting: Hematology and Oncology

## 2016-11-20 DIAGNOSIS — C50411 Malignant neoplasm of upper-outer quadrant of right female breast: Secondary | ICD-10-CM

## 2016-11-20 DIAGNOSIS — Z79811 Long term (current) use of aromatase inhibitors: Secondary | ICD-10-CM | POA: Diagnosis not present

## 2016-11-20 DIAGNOSIS — Z170 Estrogen receptor positive status [ER+]: Secondary | ICD-10-CM | POA: Diagnosis not present

## 2016-11-20 MED ORDER — TAMOXIFEN CITRATE 20 MG PO TABS: 20.0000 mg | ORAL_TABLET | Freq: Every day | ORAL | 3 refills | Status: DC

## 2016-11-20 NOTE — Progress Notes (Signed)
Patient Care Team: Cheri Fowler, MD as PCP - General (Obstetrics and Gynecology) Rolm Bookbinder, MD as Consulting Physician (General Surgery) Nicholas Lose, MD as Consulting Physician (Hematology and Oncology) Kyung Rudd, MD as Consulting Physician (Radiation Oncology) Sylvan Cheese, NP as Nurse Practitioner (Hematology and Oncology)  DIAGNOSIS:  Encounter Diagnosis  Name Primary?  . Malignant neoplasm of upper-outer quadrant of right breast in female, estrogen receptor positive ([place])     SUMMARY OF ONCOLOGIC HISTORY:   Breast cancer of upper-outer quadrant of right female breast ([place])   10/05/2015 Initial Diagnosis    Right breast mass screening detected 10:00: 1.4 x 1.2 x 0.9 cm, ultrasound biopsy: Grade 2 invasive ductal carcinoma with DCIS ER/PR positive HER-2 negative Ki-67 10%, T1 cN0 stage IA clinical stage      10/11/2015 Procedure    Genetic testing: Negative. Genes evaluated: APC, ATM, AXIN2, BARD1, BMPR1A, BRCA1, BRCA2, BRIP1, CDH1, CDKN2A, CHEK2, DICER1, EPCAM, GREM1, KIT, MEN1, MLH1, MSH2, MSH6, MUTYH, NBN, NF1, PALB2, PDGFRA, PMS2, POLD1, POLE, PTEN, RAD50, RAD51C, RAD51D, SDHA, SDHB, SDHC, SDHD, SMAD4, SMARCA4, STK11, TP53, TSC1, TSC2, and VHL      10/22/2015 Oncotype testing    Recurrence score: 13; 9% ROR (low-risk)      10/31/2015 Surgery    Right lumpectomy Donne Hazel): Invasive ductal carcinoma, grade 2, 1.5 cm, with DCIS, margins negative, 0/4 lymph nodes negative, T1 cN0 stage IA pathological stage, ER 90%, PR 90%, HER-2 negative, Ki-67 10%, Oncotype DX score 13, 9% ROR      12/11/2015 - 01/25/2016 Radiation Therapy    Adjuvant radiation therapy Lisbeth Renshaw). Right breast: 50.4 Gy in 28 fractions. Right breast boost: 10 Gy in 5 fractions.       02/2016 -  Anti-estrogen oral therapy    Tamoxifen 20 mg daily. Planned duration of treatment: 5 years        CHIEF COMPLIANT: Follow-up on tamoxifen therapy  INTERVAL HISTORY: ANALYCIA KHOKHAR is a 9 year  with above-mentioned history of right breast cancer treated with lumpectomy and radiation is currently on tamoxifen. She is tolerating it fairly well. She'll occasional hot flashes. She is complaining of weight gain. She has however not been exercising. She does feel tired during the day.  REVIEW OF SYSTEMS:   Constitutional: Denies fevers, chills or abnormal weight loss Eyes: Denies blurriness of vision Ears, nose, mouth, throat, and face: Denies mucositis or sore throat Respiratory: Denies cough, dyspnea or wheezes Cardiovascular: Denies palpitation, chest discomfort Gastrointestinal:  Denies nausea, heartburn or change in bowel habits Skin: Denies abnormal skin rashes Lymphatics: Denies new lymphadenopathy or easy bruising Neurological:Denies numbness, tingling or new weaknesses Behavioral/Psych: Mood is stable, no new changes  Extremities: No lower extremity edema Breast:  denies any pain or lumps or nodules in either breasts All other systems were reviewed with the patient and are negative.  I have reviewed the past medical history, past surgical history, social history and family history with the patient and they are unchanged from previous note.  ALLERGIES:  has No Known Allergies.  MEDICATIONS:  Current Outpatient Prescriptions  Medication Sig Dispense Refill  . ranitidine (ZANTAC) 150 MG tablet Take 150 mg by mouth 2 (two) times daily.    . tamoxifen (NOLVADEX) 20 MG tablet Take 1 tablet (20 mg total) by mouth daily. 90 tablet 3   No current facility-administered medications for this visit.     PHYSICAL EXAMINATION: ECOG PERFORMANCE STATUS: 1 - Symptomatic but completely ambulatory  Vitals:   11/20/16 2518  BP: (!) 140/96  Pulse: 76  Resp: 18  Temp: 98.2 F (36.8 C)   Filed Weights   11/20/16 0922  Weight: 207 lb 6.4 oz (94.1 kg)    GENERAL:alert, no distress and comfortable SKIN: skin color, texture, turgor are normal, no rashes or significant lesions EYES:  normal, Conjunctiva are pink and non-injected, sclera clear OROPHARYNX:no exudate, no erythema and lips, buccal mucosa, and tongue normal  NECK: supple, thyroid normal size, non-tender, without nodularity LYMPH:  no palpable lymphadenopathy in the cervical, axillary or inguinal LUNGS: clear to auscultation and percussion with normal breathing effort HEART: regular rate & rhythm and no murmurs and no lower extremity edema ABDOMEN:abdomen soft, non-tender and normal bowel sounds MUSCULOSKELETAL:no cyanosis of digits and no clubbing  NEURO: alert & oriented x 3 with fluent speech, no focal motor/sensory deficits EXTREMITIES: No lower extremity edema BREAST: No palpable masses or nodules in either right or left breasts. No palpable axillary supraclavicular or infraclavicular adenopathy no breast tenderness or nipple discharge. (exam performed in the presence of a chaperone)  LABORATORY DATA:  I have reviewed the data as listed   Chemistry      Component Value Date/Time   NA 141 10/10/2015 1226   K 4.2 10/10/2015 1226   CO2 25 10/10/2015 1226   BUN 12.4 10/10/2015 1226   CREATININE 0.8 10/10/2015 1226      Component Value Date/Time   CALCIUM 9.6 10/10/2015 1226   ALKPHOS 68 10/10/2015 1226   AST 14 10/10/2015 1226   ALT 13 10/10/2015 1226   BILITOT <0.30 10/10/2015 1226       Lab Results  Component Value Date   WBC 7.3 10/10/2015   HGB 12.6 10/10/2015   HCT 37.2 10/10/2015   MCV 85.7 10/10/2015   PLT 304 10/10/2015   NEUTROABS 4.2 10/10/2015    ASSESSMENT & PLAN:  Breast cancer of upper-outer quadrant of right female breast ([place]) Right lumpectomy 10/31/2015: Invasive ductal carcinoma, grade 2, 1.5 cm, with DCIS, margins negative, 0/4 lymph nodes negative, T1 cN0 stage IA pathological stage, ER 90%, PR 90%, HER-2 negative, Ki-67 10%, Oncotype DX score 13, 9% risk of recurrence, low risk Adjuvant radiation therapy from 12/11/2015 to 01/25/2016   Current treatment: Adjuvant  antiestrogen therapy with tamoxifen 20 mg daily started 02/11/2016 Tamoxifen toxicities: 1. Occasional hot flashes: Then not bothering her. Denies any myalgias or arthralgias. Patient will see her gynecologist once a year.  Surveillance: Mammogram 10/20/2016, breast density category C, postsurgical changes Patient's husband is planning to go to [place] for work and is looking forward to taking her for a vacation to [place]: Encouraged her to stay active and eat more fruits and vegetables Return to clinic in 1 year for follow-up  I spent 15 minutes talking to the patient of which more than half was spent in counseling and coordination of care.  No orders of the defined types were placed in this encounter.  The patient has a good understanding of the overall plan. she agrees with it. she will call with any problems that may develop before the next visit here.   Rulon Eisenmenger, MD 11/20/16

## 2016-11-20 NOTE — Assessment & Plan Note (Signed)
Right lumpectomy 10/31/2015: Invasive ductal carcinoma, grade 2, 1.5 cm, with DCIS, margins negative, 0/4 lymph nodes negative, T1 cN0 stage IA pathological stage, ER 90%, PR 90%, HER-2 negative, Ki-67 10%, Oncotype DX score 13, 9% risk of recurrence, low risk Adjuvant radiation therapy from 12/11/2015 to 01/25/2016   Current treatment: Adjuvant antiestrogen therapy with tamoxifen 20 mg daily started 02/11/2016 Tamoxifen toxicities: 1. Occasional hot flashes: Then not bothering her. Denies any myalgias or arthralgias. Patient will see her gynecologist once a year.  Surveillance: Mammogram 10/20/2016, breast density category C, postsurgical changes  Survivorship: Encouraged her to stay active and eat more fruits and vegetables Return to clinic in 6 months for follow-up after that we can see her once a year

## 2017-08-10 DIAGNOSIS — R82998 Other abnormal findings in urine: Secondary | ICD-10-CM | POA: Diagnosis not present

## 2017-08-10 DIAGNOSIS — Z0000 Encounter for general adult medical examination without abnormal findings: Secondary | ICD-10-CM | POA: Diagnosis not present

## 2017-08-17 DIAGNOSIS — K449 Diaphragmatic hernia without obstruction or gangrene: Secondary | ICD-10-CM | POA: Diagnosis not present

## 2017-08-17 DIAGNOSIS — C50911 Malignant neoplasm of unspecified site of right female breast: Secondary | ICD-10-CM | POA: Diagnosis not present

## 2017-08-17 DIAGNOSIS — Z0000 Encounter for general adult medical examination without abnormal findings: Secondary | ICD-10-CM | POA: Diagnosis not present

## 2017-08-17 DIAGNOSIS — Z1212 Encounter for screening for malignant neoplasm of rectum: Secondary | ICD-10-CM | POA: Diagnosis not present

## 2017-08-17 DIAGNOSIS — Z23 Encounter for immunization: Secondary | ICD-10-CM | POA: Diagnosis not present

## 2017-08-17 DIAGNOSIS — I10 Essential (primary) hypertension: Secondary | ICD-10-CM | POA: Diagnosis not present

## 2017-09-10 ENCOUNTER — Telehealth: Payer: Self-pay

## 2017-09-10 NOTE — Telephone Encounter (Signed)
[place] with patient concerning upcoming appointment. Per 4/19 requested reschedule changes (provider).

## 2017-10-28 ENCOUNTER — Other Ambulatory Visit: Payer: Self-pay | Admitting: Hematology and Oncology

## 2017-10-28 DIAGNOSIS — Z01419 Encounter for gynecological examination (general) (routine) without abnormal findings: Secondary | ICD-10-CM | POA: Diagnosis not present

## 2017-10-28 DIAGNOSIS — Z124 Encounter for screening for malignant neoplasm of cervix: Secondary | ICD-10-CM | POA: Diagnosis not present

## 2017-10-28 DIAGNOSIS — Z139 Encounter for screening, unspecified: Secondary | ICD-10-CM

## 2017-10-30 ENCOUNTER — Other Ambulatory Visit: Payer: Self-pay | Admitting: Hematology and Oncology

## 2017-10-30 DIAGNOSIS — Z853 Personal history of malignant neoplasm of breast: Secondary | ICD-10-CM

## 2017-11-03 ENCOUNTER — Ambulatory Visit
Admission: RE | Admit: 2017-11-03 | Discharge: 2017-11-03 | Disposition: A | Discharge status: Home / Self Care | Payer: 59 | Source: Ambulatory Visit | Attending: Hematology and Oncology | Admitting: Hematology and Oncology

## 2017-11-03 DIAGNOSIS — Z853 Personal history of malignant neoplasm of breast: Secondary | ICD-10-CM

## 2017-11-03 HISTORY — DX: Personal history of irradiation: Z92.3

## 2017-11-15 NOTE — Assessment & Plan Note (Addendum)
Right lumpectomy 10/31/2015: Invasive ductal carcinoma, grade 2, 1.5 cm, with DCIS, margins negative, 0/4 lymph nodes negative, T1 cN0 stage IA pathological stage, ER 90%, PR 90%, HER-2 negative, Ki-67 10%, Oncotype DX score 13, 9% risk of recurrence, low risk Adjuvant radiation therapy from 12/11/2015 to 01/25/2016   Current treatment: Adjuvant antiestrogen therapy with tamoxifen 20 mg daily started 02/11/2016 Tamoxifen toxicities: 1. Occasional hot flashes: Then not bothering her. Denies any myalgias or arthralgias. Patient will see her gynecologist once a year.  Surveillance: Mammogram 11/03/17, breast density category C, postsurgical changes  Survivorship: Encouraged her to stay active and eat more fruits and vegetables Return to clinic in 1 year for follow up

## 2017-11-16 ENCOUNTER — Telehealth: Payer: Self-pay | Admitting: Hematology and Oncology

## 2017-11-16 ENCOUNTER — Inpatient Hospital Stay: Payer: 59 | Attending: Hematology and Oncology | Admitting: Hematology and Oncology

## 2017-11-16 DIAGNOSIS — Z923 Personal history of irradiation: Secondary | ICD-10-CM | POA: Insufficient documentation

## 2017-11-16 DIAGNOSIS — Z79811 Long term (current) use of aromatase inhibitors: Secondary | ICD-10-CM | POA: Insufficient documentation

## 2017-11-16 DIAGNOSIS — R252 Cramp and spasm: Secondary | ICD-10-CM | POA: Insufficient documentation

## 2017-11-16 DIAGNOSIS — Z170 Estrogen receptor positive status [ER+]: Secondary | ICD-10-CM

## 2017-11-16 DIAGNOSIS — N951 Menopausal and female climacteric states: Secondary | ICD-10-CM | POA: Insufficient documentation

## 2017-11-16 DIAGNOSIS — C50411 Malignant neoplasm of upper-outer quadrant of right female breast: Secondary | ICD-10-CM | POA: Insufficient documentation

## 2017-11-16 MED ORDER — TAMOXIFEN CITRATE 20 MG PO TABS: 20.0000 mg | ORAL_TABLET | Freq: Every day | ORAL | 3 refills | Status: DC

## 2017-11-16 MED ORDER — VALACYCLOVIR HCL 1 G PO TABS: 1000.0000 mg | ORAL_TABLET | ORAL | Status: AC | PRN

## 2017-11-16 NOTE — Progress Notes (Signed)
Patient Care Team: Cheri Fowler, MD as PCP - General (Obstetrics and Gynecology) Rolm Bookbinder, MD as Consulting Physician (General Surgery) Nicholas Lose, MD as Consulting Physician (Hematology and Oncology) Kyung Rudd, MD as Consulting Physician (Radiation Oncology) Sylvan Cheese, NP as Nurse Practitioner (Hematology and Oncology)  DIAGNOSIS:  Encounter Diagnosis  Name Primary?  . Malignant neoplasm of upper-outer quadrant of right breast in female, estrogen receptor positive ([place])     SUMMARY OF ONCOLOGIC HISTORY:   Breast cancer of upper-outer quadrant of right female breast ([place])   10/05/2015 Initial Diagnosis    Right breast mass screening detected 10:00: 1.4 x 1.2 x 0.9 cm, ultrasound biopsy: Grade 2 invasive ductal carcinoma with DCIS ER/PR positive HER-2 negative Ki-67 10%, T1 cN0 stage IA clinical stage      10/11/2015 Procedure    Genetic testing: Negative. Genes evaluated: APC, ATM, AXIN2, BARD1, BMPR1A, BRCA1, BRCA2, BRIP1, CDH1, CDKN2A, CHEK2, DICER1, EPCAM, GREM1, KIT, MEN1, MLH1, MSH2, MSH6, MUTYH, NBN, NF1, PALB2, PDGFRA, PMS2, POLD1, POLE, PTEN, RAD50, RAD51C, RAD51D, SDHA, SDHB, SDHC, SDHD, SMAD4, SMARCA4, STK11, TP53, TSC1, TSC2, and VHL      10/22/2015 Oncotype testing    Recurrence score: 13; 9% ROR (low-risk)      10/31/2015 Surgery    Right lumpectomy Donne Hazel): Invasive ductal carcinoma, grade 2, 1.5 cm, with DCIS, margins negative, 0/4 lymph nodes negative, T1 cN0 stage IA pathological stage, ER 90%, PR 90%, HER-2 negative, Ki-67 10%, Oncotype DX score 13, 9% ROR      12/11/2015 - 01/25/2016 Radiation Therapy    Adjuvant radiation therapy Lisbeth Renshaw). Right breast: 50.4 Gy in 28 fractions. Right breast boost: 10 Gy in 5 fractions.       02/2016 -  Anti-estrogen oral therapy    Tamoxifen 20 mg daily. Planned duration of treatment: 5 years        CHIEF COMPLIANT: Follow-up on tamoxifen therapy  INTERVAL HISTORY: Dorothy Gallagher is a  47 year old with above-mentioned history of right breast cancer treated with lumpectomy radiation is currently on tamoxifen therapy.  She appears to be tolerating tamoxifen extremely well.  She does have occasional hot flashes but they are not bothering her significantly.  She denies any lumps or nodules in the breast.  She has also noted occasional cramps in the legs.  REVIEW OF SYSTEMS:   Constitutional: Denies fevers, chills or abnormal weight loss Eyes: Denies blurriness of vision Ears, nose, mouth, throat, and face: Denies mucositis or sore throat Respiratory: Denies cough, dyspnea or wheezes Cardiovascular: Denies palpitation, chest discomfort Gastrointestinal:  Denies nausea, heartburn or change in bowel habits Skin: Denies abnormal skin rashes Lymphatics: Denies new lymphadenopathy or easy bruising Neurological:Denies numbness, tingling or new weaknesses Behavioral/Psych: Mood is stable, no new changes  Extremities: No lower extremity edema Breast:  denies any pain or lumps or nodules in either breasts All other systems were reviewed with the patient and are negative.  I have reviewed the past medical history, past surgical history, social history and family history with the patient and they are unchanged from previous note.  ALLERGIES:  has No Known Allergies.  MEDICATIONS:  Current Outpatient Medications  Medication Sig Dispense Refill  . ranitidine (ZANTAC) 150 MG tablet Take 150 mg by mouth 2 (two) times daily.    . tamoxifen (NOLVADEX) 20 MG tablet Take 1 tablet (20 mg total) by mouth daily. 90 tablet 3  . valACYclovir (VALTREX) 1000 MG tablet Take 1 tablet (1,000 mg total) by mouth as needed.  No current facility-administered medications for this visit.     PHYSICAL EXAMINATION: ECOG PERFORMANCE STATUS: 1 - Symptomatic but completely ambulatory  Vitals:   11/16/17 1045  BP: (!) 146/86  Pulse: 63  Resp: 18  Temp: 98.4 F (36.9 C)  SpO2: 100%   Filed Weights     11/16/17 1045  Weight: 189 lb 8 oz (86 kg)    GENERAL:alert, no distress and comfortable SKIN: skin color, texture, turgor are normal, no rashes or significant lesions EYES: normal, Conjunctiva are pink and non-injected, sclera clear OROPHARYNX:no exudate, no erythema and lips, buccal mucosa, and tongue normal  NECK: supple, thyroid normal size, non-tender, without nodularity LYMPH:  no palpable lymphadenopathy in the cervical, axillary or inguinal LUNGS: clear to auscultation and percussion with normal breathing effort HEART: regular rate & rhythm and no murmurs and no lower extremity edema ABDOMEN:abdomen soft, non-tender and normal bowel sounds MUSCULOSKELETAL:no cyanosis of digits and no clubbing  NEURO: alert & oriented x 3 with fluent speech, no focal motor/sensory deficits EXTREMITIES: No lower extremity edema BREAST: No palpable masses or nodules in either right or left breasts. No palpable axillary supraclavicular or infraclavicular adenopathy no breast tenderness or nipple discharge. (exam performed in the presence of a chaperone)  LABORATORY DATA:  I have reviewed the data as listed CMP Latest Ref Rng & Units 10/10/2015  Glucose 70 - 140 mg/dl 97  BUN 7.0 - 26.0 mg/dL 12.4  Creatinine 0.6 - 1.1 mg/dL 0.8  Sodium 136 - 145 mEq/L 141  Potassium 3.5 - 5.1 mEq/L 4.2  CO2 22 - 29 mEq/L 25  Calcium 8.4 - 10.4 mg/dL 9.6  Total Protein 6.4 - 8.3 g/dL 7.4  Total Bilirubin 0.20 - 1.20 mg/dL <0.30  Alkaline Phos 40 - 150 U/L 68  AST 5 - 34 U/L 14  ALT 0 - 55 U/L 13    Lab Results  Component Value Date   WBC 7.3 10/10/2015   HGB 12.6 10/10/2015   HCT 37.2 10/10/2015   MCV 85.7 10/10/2015   PLT 304 10/10/2015   NEUTROABS 4.2 10/10/2015    ASSESSMENT & PLAN:  Breast cancer of upper-outer quadrant of right female breast ([place]) Right lumpectomy 10/31/2015: Invasive ductal carcinoma, grade 2, 1.5 cm, with DCIS, margins negative, 0/4 lymph nodes negative, T1 cN0 stage IA  pathological stage, ER 90%, PR 90%, HER-2 negative, Ki-67 10%, Oncotype DX score 13, 9% risk of recurrence, low risk Adjuvant radiation therapy from 12/11/2015 to 01/25/2016   Current treatment: Adjuvant antiestrogen therapy with tamoxifen 20 mg daily started 02/11/2016 Tamoxifen toxicities: 1. Occasional hot flashes: Then not bothering her.   2. Occasional muscle cramps Denies any myalgias or arthralgias. Patient will see her gynecologist once a year.  Surveillance: Mammogram 11/03/17, breast density category C, postsurgical changes  Survivorship: Encouraged her to stay active and eat more fruits and vegetables Return to clinic in 6 months for follow-up and after that we can see her once a year   No orders of the defined types were placed in this encounter.  The patient has a good understanding of the overall plan. she agrees with it. she will call with any problems that may develop before the next visit here.   Harriette Ohara, MD 11/16/17

## 2017-11-16 NOTE — Telephone Encounter (Signed)
Gave patient AVs and calendar of upcoming October appointments.  °

## 2017-11-20 ENCOUNTER — Ambulatory Visit: Payer: Managed Care, Other (non HMO) | Admitting: Hematology and Oncology

## 2018-05-17 ENCOUNTER — Telehealth: Payer: Self-pay | Admitting: Hematology and Oncology

## 2018-05-17 ENCOUNTER — Inpatient Hospital Stay: Payer: 59 | Attending: Hematology and Oncology | Admitting: Hematology and Oncology

## 2018-05-17 DIAGNOSIS — Z79810 Long term (current) use of selective estrogen receptor modulators (SERMs): Secondary | ICD-10-CM | POA: Diagnosis not present

## 2018-05-17 DIAGNOSIS — Z79899 Other long term (current) drug therapy: Secondary | ICD-10-CM | POA: Diagnosis not present

## 2018-05-17 DIAGNOSIS — N951 Menopausal and female climacteric states: Secondary | ICD-10-CM | POA: Diagnosis not present

## 2018-05-17 DIAGNOSIS — Z923 Personal history of irradiation: Secondary | ICD-10-CM | POA: Diagnosis not present

## 2018-05-17 DIAGNOSIS — Z170 Estrogen receptor positive status [ER+]: Secondary | ICD-10-CM | POA: Diagnosis not present

## 2018-05-17 DIAGNOSIS — C50411 Malignant neoplasm of upper-outer quadrant of right female breast: Secondary | ICD-10-CM | POA: Diagnosis not present

## 2018-05-17 DIAGNOSIS — F329 Major depressive disorder, single episode, unspecified: Secondary | ICD-10-CM | POA: Diagnosis not present

## 2018-05-17 MED ORDER — VENLAFAXINE HCL ER 37.5 MG PO CP24: 37.5000 mg | ORAL_CAPSULE | Freq: Every day | ORAL | 6 refills | Status: DC

## 2018-05-17 NOTE — Assessment & Plan Note (Signed)
Right lumpectomy 10/31/2015: Invasive ductal carcinoma, grade 2, 1.5 cm, with DCIS, margins negative, 0/4 lymph nodes negative, T1 cN0 stage IA pathological stage, ER 90%, PR 90%, HER-2 negative, Ki-67 10%, Oncotype DX score 13, 9% risk of recurrence, low risk Adjuvant radiation therapy from 12/11/2015 to 01/25/2016   Current treatment: Adjuvant antiestrogen therapy with tamoxifen 20 mg daily started 02/11/2016 Tamoxifen toxicities: 1. Occasional hot flashes: Then not bothering her.   2. Occasional muscle cramps 3.  Profound depression: Patient is in tears describing that she does not have any energy and she feels down all the time.  I started her on Effexor XR 37.5 mg daily.  If necessary we can get her counseling.  Patient appears to be overwhelmed because her husband is living out of town and her son is getting married and she has a lot on her plate. Denies any myalgias or arthralgias.   Surveillance: Mammogram 11/03/17, breast density category C, postsurgical changes  I recommended a follow-up in 3 months to assess if her symptoms are getting better.  After that we can see her once a year if she is stable.

## 2018-05-17 NOTE — Progress Notes (Signed)
Patient Care Team: Tisovec, Fransico Him, MD as PCP - General (Internal Medicine) Rolm Bookbinder, MD as Consulting Physician (General Surgery) Nicholas Lose, MD as Consulting Physician (Hematology and Oncology) Kyung Rudd, MD as Consulting Physician (Radiation Oncology) Sylvan Cheese, NP as Nurse Practitioner (Hematology and Oncology)  DIAGNOSIS:  Encounter Diagnosis  Name Primary?  . Malignant neoplasm of upper-outer quadrant of right breast in female, estrogen receptor positive ([place])     SUMMARY OF ONCOLOGIC HISTORY:   Breast cancer of upper-outer quadrant of right female breast ([place])   10/05/2015 Initial Diagnosis    Right breast mass screening detected 10:00: 1.4 x 1.2 x 0.9 cm, ultrasound biopsy: Grade 2 invasive ductal carcinoma with DCIS ER/PR positive HER-2 negative Ki-67 10%, T1 cN0 stage IA clinical stage    10/11/2015 Procedure    Genetic testing: Negative. Genes evaluated: APC, ATM, AXIN2, BARD1, BMPR1A, BRCA1, BRCA2, BRIP1, CDH1, CDKN2A, CHEK2, DICER1, EPCAM, GREM1, KIT, MEN1, MLH1, MSH2, MSH6, MUTYH, NBN, NF1, PALB2, PDGFRA, PMS2, POLD1, POLE, PTEN, RAD50, RAD51C, RAD51D, SDHA, SDHB, SDHC, SDHD, SMAD4, SMARCA4, STK11, TP53, TSC1, TSC2, and VHL    10/22/2015 Oncotype testing    Recurrence score: 13; 9% ROR (low-risk)    10/31/2015 Surgery    Right lumpectomy Donne Hazel): Invasive ductal carcinoma, grade 2, 1.5 cm, with DCIS, margins negative, 0/4 lymph nodes negative, T1 cN0 stage IA pathological stage, ER 90%, PR 90%, HER-2 negative, Ki-67 10%, Oncotype DX score 13, 9% ROR    12/11/2015 - 01/25/2016 Radiation Therapy    Adjuvant radiation therapy Lisbeth Renshaw). Right breast: 50.4 Gy in 28 fractions. Right breast boost: 10 Gy in 5 fractions.     02/2016 -  Anti-estrogen oral therapy    Tamoxifen 20 mg daily. Planned duration of treatment: 5 years      CHIEF COMPLIANT: Severe depression symptoms  INTERVAL HISTORY: Dorothy Gallagher is a 64-year with above-mentioned  history of right breast cancer currently on tamoxifen.  She is here complaining of profound depression symptoms.  She has been crying and having mood fluctuations constantly.  It appears that there are lot of things happening in her life including that her husband is working out of town and she has her son's wedding to plan.  She is also not working which is making it difficult for her to have a social life outside of her family.  All of these have gotten worse since the start of tamoxifen.  REVIEW OF SYSTEMS:   Constitutional: Denies fevers, chills or abnormal weight loss Eyes: Denies blurriness of vision Ears, nose, mouth, throat, and face: Denies mucositis or sore throat Respiratory: Denies cough, dyspnea or wheezes Cardiovascular: Denies palpitation, chest discomfort Gastrointestinal:  Denies nausea, heartburn or change in bowel habits Skin: Denies abnormal skin rashes Lymphatics: Denies new lymphadenopathy or easy bruising Neurological:Denies numbness, tingling or new weaknesses Behavioral/Psych: Depression symptoms Extremities: No lower extremity edema  All other systems were reviewed with the patient and are negative.  I have reviewed the past medical history, past surgical history, social history and family history with the patient and they are unchanged from previous note.  ALLERGIES:  has No Known Allergies.  MEDICATIONS:  Current Outpatient Medications  Medication Sig Dispense Refill  . ranitidine (ZANTAC) 150 MG tablet Take 150 mg by mouth 2 (two) times daily.    . tamoxifen (NOLVADEX) 20 MG tablet Take 1 tablet (20 mg total) by mouth daily. 90 tablet 3  . valACYclovir (VALTREX) 1000 MG tablet Take 1 tablet (1,000 mg total)  by mouth as needed.    . venlafaxine XR (EFFEXOR-XR) 37.5 MG 24 hr capsule Take 1 capsule (37.5 mg total) by mouth daily with breakfast. 30 capsule 6   No current facility-administered medications for this visit.     PHYSICAL EXAMINATION: ECOG  PERFORMANCE STATUS: 1 - Symptomatic but completely ambulatory  Vitals:   05/17/18 1038  BP: 130/84  Pulse: 69  Resp: 17  Temp: 98.6 F (37 C)  SpO2: 100%   Filed Weights   05/17/18 1038  Weight: 169 lb 8 oz (76.9 kg)    GENERAL:alert, no distress and comfortable SKIN: skin color, texture, turgor are normal, no rashes or significant lesions EYES: normal, Conjunctiva are pink and non-injected, sclera clear OROPHARYNX:no exudate, no erythema and lips, buccal mucosa, and tongue normal  NECK: supple, thyroid normal size, non-tender, without nodularity LYMPH:  no palpable lymphadenopathy in the cervical, axillary or inguinal LUNGS: clear to auscultation and percussion with normal breathing effort HEART: regular rate & rhythm and no murmurs and no lower extremity edema ABDOMEN:abdomen soft, non-tender and normal bowel sounds MUSCULOSKELETAL:no cyanosis of digits and no clubbing  NEURO: alert & oriented x 3 with fluent speech, no focal motor/sensory deficits EXTREMITIES: No lower extremity edema   LABORATORY DATA:  I have reviewed the data as listed CMP Latest Ref Rng & Units 10/10/2015  Glucose 70 - 140 mg/dl 97  BUN 7.0 - 26.0 mg/dL 12.4  Creatinine 0.6 - 1.1 mg/dL 0.8  Sodium 136 - 145 mEq/L 141  Potassium 3.5 - 5.1 mEq/L 4.2  CO2 22 - 29 mEq/L 25  Calcium 8.4 - 10.4 mg/dL 9.6  Total Protein 6.4 - 8.3 g/dL 7.4  Total Bilirubin 0.20 - 1.20 mg/dL <0.30  Alkaline Phos 40 - 150 U/L 68  AST 5 - 34 U/L 14  ALT 0 - 55 U/L 13    Lab Results  Component Value Date   WBC 7.3 10/10/2015   HGB 12.6 10/10/2015   HCT 37.2 10/10/2015   MCV 85.7 10/10/2015   PLT 304 10/10/2015   NEUTROABS 4.2 10/10/2015    ASSESSMENT & PLAN:  Breast cancer of upper-outer quadrant of right female breast ([place]) Right lumpectomy 10/31/2015: Invasive ductal carcinoma, grade 2, 1.5 cm, with DCIS, margins negative, 0/4 lymph nodes negative, T1 cN0 stage IA pathological stage, ER 90%, PR 90%, HER-2  negative, Ki-67 10%, Oncotype DX score 13, 9% risk of recurrence, low risk Adjuvant radiation therapy from 12/11/2015 to 01/25/2016   Current treatment: Adjuvant antiestrogen therapy with tamoxifen 20 mg daily started 02/11/2016  Tamoxifen toxicities: 1. Occasional hot flashes: Then not bothering her.   2. Occasional muscle cramps 3.  Profound depression: Patient is in tears describing that she does not have any energy and she feels down all the time.  I started her on Effexor XR 37.5 mg daily.  If necessary we can get her counseling.  Patient appears to be overwhelmed because her husband is living out of town and her son is getting married and she has a lot on her plate. Denies any myalgias or arthralgias.   Surveillance: Mammogram 11/03/17, breast density category C, postsurgical changes  I recommended a follow-up in 3 months to assess if her symptoms are getting better.  After that we can see her once a year if she is stable.   No orders of the defined types were placed in this encounter.  The patient has a good understanding of the overall plan. she agrees with it. she will call  with any problems that may develop before the next visit here.   Dorothy Ohara, MD 05/17/18

## 2018-05-17 NOTE — Telephone Encounter (Signed)
Gave avs and calendar ° °

## 2018-08-12 NOTE — Progress Notes (Signed)
Patient Care Team: Tisovec, Fransico Him, MD as PCP - General (Internal Medicine) Rolm Bookbinder, MD as Consulting Physician (General Surgery) Nicholas Lose, MD as Consulting Physician (Hematology and Oncology) Kyung Rudd, MD as Consulting Physician (Radiation Oncology) Sylvan Cheese, NP as Nurse Practitioner (Hematology and Oncology)  DIAGNOSIS:    ICD-10-CM   1. Malignant neoplasm of upper-outer quadrant of right breast in female, estrogen receptor positive ([place]) C50.411 MM DIAG BREAST TOMO BILATERAL   Z17.0     SUMMARY OF ONCOLOGIC HISTORY:   Breast cancer of upper-outer quadrant of right female breast ([place])   10/05/2015 Initial Diagnosis    Right breast mass screening detected 10:00: 1.4 x 1.2 x 0.9 cm, ultrasound biopsy: Grade 2 invasive ductal carcinoma with DCIS ER/PR positive HER-2 negative Ki-67 10%, T1 cN0 stage IA clinical stage    10/11/2015 Procedure    Genetic testing: Negative. Genes evaluated: APC, ATM, AXIN2, BARD1, BMPR1A, BRCA1, BRCA2, BRIP1, CDH1, CDKN2A, CHEK2, DICER1, EPCAM, GREM1, KIT, MEN1, MLH1, MSH2, MSH6, MUTYH, NBN, NF1, PALB2, PDGFRA, PMS2, POLD1, POLE, PTEN, RAD50, RAD51C, RAD51D, SDHA, SDHB, SDHC, SDHD, SMAD4, SMARCA4, STK11, TP53, TSC1, TSC2, and VHL    10/22/2015 Oncotype testing    Recurrence score: 13; 9% ROR (low-risk)    10/31/2015 Surgery    Right lumpectomy Donne Hazel): Invasive ductal carcinoma, grade 2, 1.5 cm, with DCIS, margins negative, 0/4 lymph nodes negative, T1 cN0 stage IA pathological stage, ER 90%, PR 90%, HER-2 negative, Ki-67 10%, Oncotype DX score 13, 9% ROR    12/11/2015 - 01/25/2016 Radiation Therapy    Adjuvant radiation therapy Lisbeth Renshaw). Right breast: 50.4 Gy in 28 fractions. Right breast boost: 10 Gy in 5 fractions.     02/2016 -  Anti-estrogen oral therapy    Tamoxifen 20 mg daily. Planned duration of treatment: 5 years      CHIEF COMPLIANT: Follow-up of Tamoxifen and Effexor  INTERVAL HISTORY: Dorothy Gallagher is a  48 y.o. with above-mentioned history of right breast cancer treated with lumpectomy and radiation and is currently on Effexor and Tamoxifen. She presents to the clinic today alone and notes her depression has improved significantly. She reports occasional hot flashes and occasional muscle cramps in her feet. Her insurance does not cover diagnostic mammograms and she had to pay out of pocket last time and will make a decision regarding her next mammogram.   REVIEW OF SYSTEMS:   Constitutional: Denies fevers, chills or abnormal weight loss (+) occasional hot flashes Eyes: Denies blurriness of vision Ears, nose, mouth, throat, and face: Denies mucositis or sore throat Respiratory: Denies cough, dyspnea or wheezes Cardiovascular: Denies palpitation, chest discomfort Gastrointestinal:  Denies nausea, heartburn or change in bowel habits Skin: Denies abnormal skin rashes MSK: (+) occasional muscle cramps in feet Lymphatics: Denies new lymphadenopathy or easy bruising Neurological: Denies numbness, tingling or new weaknesses Behavioral/Psych: Mood is stable, no new changes  Extremities: No lower extremity edema Breast: denies any pain or lumps or nodules in either breasts All other systems were reviewed with the patient and are negative.  I have reviewed the past medical history, past surgical history, social history and family history with the patient and they are unchanged from previous note.  ALLERGIES:  has No Known Allergies.  MEDICATIONS:  Current Outpatient Medications  Medication Sig Dispense Refill  . ranitidine (ZANTAC) 150 MG tablet Take 150 mg by mouth 2 (two) times daily.    . tamoxifen (NOLVADEX) 20 MG tablet Take 1 tablet (20 mg total) by mouth  daily. 90 tablet 3  . valACYclovir (VALTREX) 1000 MG tablet Take 1 tablet (1,000 mg total) by mouth as needed.    . venlafaxine XR (EFFEXOR-XR) 37.5 MG 24 hr capsule Take 1 capsule (37.5 mg total) by mouth daily with breakfast. 30 capsule 6     No current facility-administered medications for this visit.     PHYSICAL EXAMINATION: ECOG PERFORMANCE STATUS: 1 - Symptomatic but completely ambulatory  Vitals:   08/17/18 0954  BP: 133/87  Pulse: 87  Resp: 20  Temp: 98.3 F (36.8 C)  SpO2: 100%   Filed Weights   08/17/18 0954  Weight: 175 lb 8 oz (79.6 kg)    GENERAL: alert, no distress and comfortable SKIN: skin color, texture, turgor are normal, no rashes or significant lesions EYES: normal, Conjunctiva are pink and non-injected, sclera clear OROPHARYNX: no exudate, no erythema and lips, buccal mucosa, and tongue normal  NECK: supple, thyroid normal size, non-tender, without nodularity LYMPH: no palpable lymphadenopathy in the cervical, axillary or inguinal LUNGS: clear to auscultation and percussion with normal breathing effort HEART: regular rate & rhythm and no murmurs and no lower extremity edema ABDOMEN: abdomen soft, non-tender and normal bowel sounds MUSCULOSKELETAL: no cyanosis of digits and no clubbing  NEURO: alert & oriented x 3 with fluent speech, no focal motor/sensory deficits EXTREMITIES: No lower extremity edema  LABORATORY DATA:  I have reviewed the data as listed CMP Latest Ref Rng & Units 10/10/2015  Glucose 70 - 140 mg/dl 97  BUN 7.0 - 26.0 mg/dL 12.4  Creatinine 0.6 - 1.1 mg/dL 0.8  Sodium 136 - 145 mEq/L 141  Potassium 3.5 - 5.1 mEq/L 4.2  CO2 22 - 29 mEq/L 25  Calcium 8.4 - 10.4 mg/dL 9.6  Total Protein 6.4 - 8.3 g/dL 7.4  Total Bilirubin 0.20 - 1.20 mg/dL <0.30  Alkaline Phos 40 - 150 U/L 68  AST 5 - 34 U/L 14  ALT 0 - 55 U/L 13    Lab Results  Component Value Date   WBC 7.3 10/10/2015   HGB 12.6 10/10/2015   HCT 37.2 10/10/2015   MCV 85.7 10/10/2015   PLT 304 10/10/2015   NEUTROABS 4.2 10/10/2015    ASSESSMENT & PLAN:  Breast cancer of upper-outer quadrant of right female breast ([place]) Right lumpectomy 10/31/2015: Invasive ductal carcinoma, grade 2, 1.5 cm, with DCIS,  margins negative, 0/4 lymph nodes negative, T1 cN0 stage IA pathological stage, ER 90%, PR 90%, HER-2 negative, Ki-67 10%, Oncotype DX score 13, 9% risk of recurrence, low risk Adjuvant radiation therapy from 12/11/2015 to 01/25/2016   Current treatment: Adjuvant antiestrogen therapy with tamoxifen 20 mg daily started 02/11/2016  Tamoxifen toxicities: 1. Occasional hot flashes: Then not bothering her. 2.Occasional muscle cramps 3.  Profound depression: On Effexor 37.5 mg started 05/17/18: Remarkable improvement in her symptoms and she will continue on with Effexor at the same dosage.  Patient appears to be overwhelmed because her husband is living out of town and her son is getting married and she had a lot on her plate.  Breast cancer surveillance: 1. Mammograms: 11/03/17: Benign 2. Breast Exams: 08/17/18: Benign  RTC in 1 year for follow ups   Orders Placed This Encounter  Procedures  . MM DIAG BREAST TOMO BILATERAL    Standing Status:   Future    Standing Expiration Date:   08/18/2019    Order Specific Question:   Reason for Exam (SYMPTOM  OR DIAGNOSIS REQUIRED)    Answer:  Annual diagnostic mammograms    Order Specific Question:   Is the patient pregnant?    Answer:   No    Order Specific Question:   Preferred imaging location?    Answer:   [facility]   The patient has a good understanding of the overall plan. she agrees with it. she will call with any problems that may develop before the next visit here.  Nicholas Lose, MD 08/17/2018  Julious Oka Dorshimer am acting as scribe for Dr. Nicholas Lose.  I have reviewed the above documentation for accuracy and completeness, and I agree with the above.

## 2018-08-17 ENCOUNTER — Telehealth: Payer: Self-pay | Admitting: Hematology and Oncology

## 2018-08-17 ENCOUNTER — Inpatient Hospital Stay: Payer: 59 | Attending: Hematology and Oncology | Admitting: Hematology and Oncology

## 2018-08-17 DIAGNOSIS — Z79811 Long term (current) use of aromatase inhibitors: Secondary | ICD-10-CM | POA: Diagnosis not present

## 2018-08-17 DIAGNOSIS — Z170 Estrogen receptor positive status [ER+]: Secondary | ICD-10-CM | POA: Insufficient documentation

## 2018-08-17 DIAGNOSIS — N951 Menopausal and female climacteric states: Secondary | ICD-10-CM | POA: Diagnosis not present

## 2018-08-17 DIAGNOSIS — Z923 Personal history of irradiation: Secondary | ICD-10-CM | POA: Diagnosis not present

## 2018-08-17 DIAGNOSIS — R252 Cramp and spasm: Secondary | ICD-10-CM | POA: Diagnosis not present

## 2018-08-17 DIAGNOSIS — C50411 Malignant neoplasm of upper-outer quadrant of right female breast: Secondary | ICD-10-CM | POA: Insufficient documentation

## 2018-08-17 DIAGNOSIS — Z79899 Other long term (current) drug therapy: Secondary | ICD-10-CM | POA: Insufficient documentation

## 2018-08-17 DIAGNOSIS — F329 Major depressive disorder, single episode, unspecified: Secondary | ICD-10-CM

## 2018-08-17 MED ORDER — TAMOXIFEN CITRATE 20 MG PO TABS: 20.0000 mg | ORAL_TABLET | Freq: Every day | ORAL | 3 refills | Status: DC

## 2018-08-17 NOTE — Assessment & Plan Note (Signed)
Right lumpectomy 10/31/2015: Invasive ductal carcinoma, grade 2, 1.5 cm, with DCIS, margins negative, 0/4 lymph nodes negative, T1 cN0 stage IA pathological stage, ER 90%, PR 90%, HER-2 negative, Ki-67 10%, Oncotype DX score 13, 9% risk of recurrence, low risk Adjuvant radiation therapy from 12/11/2015 to 01/25/2016   Current treatment: Adjuvant antiestrogen therapy with tamoxifen 20 mg daily started 02/11/2016  Tamoxifen toxicities: 1. Occasional hot flashes: Then not bothering her. 2.Occasional muscle cramps 3.  Profound depression: On Effexor 37.5 mg started 05/17/18  Patient appears to be overwhelmed because her husband is living out of town and her son is getting married and she has a lot on her plate.  Breast cancer surveillance: 1. Mammograms: 11/03/17: Benign 2. Breast Exams: 08/17/18: Benign  RTC in 1 year for follow ups

## 2018-08-17 NOTE — Telephone Encounter (Signed)
Gave avs and calendar ° °

## 2018-11-18 ENCOUNTER — Ambulatory Visit
Admission: RE | Admit: 2018-11-18 | Discharge: 2018-11-18 | Disposition: A | Discharge status: Home / Self Care | Payer: 59 | Source: Ambulatory Visit | Attending: Hematology and Oncology | Admitting: Hematology and Oncology

## 2018-11-18 ENCOUNTER — Other Ambulatory Visit: Payer: Self-pay

## 2018-11-18 DIAGNOSIS — Z170 Estrogen receptor positive status [ER+]: Principal | ICD-10-CM

## 2018-11-18 DIAGNOSIS — C50411 Malignant neoplasm of upper-outer quadrant of right female breast: Secondary | ICD-10-CM

## 2019-01-02 ENCOUNTER — Other Ambulatory Visit: Payer: Self-pay | Admitting: Hematology and Oncology

## 2019-01-11 ENCOUNTER — Other Ambulatory Visit: Payer: Self-pay | Admitting: Hematology and Oncology

## 2019-01-13 ENCOUNTER — Other Ambulatory Visit: Payer: Self-pay | Admitting: Hematology and Oncology

## 2019-04-11 ENCOUNTER — Other Ambulatory Visit: Payer: Self-pay | Admitting: Hematology and Oncology

## 2019-08-16 ENCOUNTER — Telehealth: Payer: Self-pay | Admitting: Hematology and Oncology

## 2019-08-16 NOTE — Telephone Encounter (Signed)
Returned patient's phone call regarding rescheduling an appointment, left a voicemail. 

## 2019-08-18 ENCOUNTER — Inpatient Hospital Stay: Payer: 59 | Attending: Hematology and Oncology | Admitting: Hematology and Oncology

## 2019-08-18 NOTE — Assessment & Plan Note (Deleted)
Right lumpectomy 10/31/2015: Invasive ductal carcinoma, grade 2, 1.5 cm, with DCIS, margins negative, 0/4 lymph nodes negative, T1 cN0 stage IA pathological stage, ER 90%, PR 90%, HER-2 negative, Ki-67 10%, Oncotype DX score 13, 9% risk of recurrence, low risk Adjuvant radiation therapy from 12/11/2015 to 01/25/2016   Current treatment: Adjuvant antiestrogen therapy with tamoxifen 20 mg daily started 02/11/2016  Tamoxifen toxicities: 1. Occasional hot flashes: Then not bothering her. 2.Occasional muscle cramps 3.Profound depression: On Effexor 37.5 mg started 05/17/18: Remarkable improvement in her symptoms and she will continue on with Effexor at the same dosage.  Patient appears to be overwhelmed because her husband is living out of town and her son is getting married and she had a lot on her plate.  Breast cancer surveillance: 1. Mammograms: 11/18/2018 benign breast density category C 2. Breast Exams: 08/18/2019: Benign  RTC in 1 year for follow ups

## 2019-09-05 ENCOUNTER — Telehealth: Payer: Self-pay | Admitting: Hematology and Oncology

## 2019-09-05 NOTE — Telephone Encounter (Signed)
Scheduled appt per 1/27 sch message - pt aware of appt date and time   

## 2019-10-03 NOTE — Progress Notes (Signed)
Patient Care Team: Tisovec, Fransico Him, MD as PCP - General (Internal Medicine) Rolm Bookbinder, MD as Consulting Physician (General Surgery) Nicholas Lose, MD as Consulting Physician (Hematology and Oncology) Kyung Rudd, MD as Consulting Physician (Radiation Oncology) Sylvan Cheese, NP as Nurse Practitioner (Hematology and Oncology)  DIAGNOSIS:    ICD-10-CM   1. Malignant neoplasm of upper-outer quadrant of right breast in female, estrogen receptor positive ([place])  C50.411    Z17.0     SUMMARY OF ONCOLOGIC HISTORY: Oncology History  Breast cancer of upper-outer quadrant of right female breast ([place])  10/05/2015 Initial Diagnosis   Right breast mass screening detected 10:00: 1.4 x 1.2 x 0.9 cm, ultrasound biopsy: Grade 2 invasive ductal carcinoma with DCIS ER/PR positive HER-2 negative Ki-67 10%, T1 cN0 stage IA clinical stage   10/11/2015 Procedure   Genetic testing: Negative. Genes evaluated: APC, ATM, AXIN2, BARD1, BMPR1A, BRCA1, BRCA2, BRIP1, CDH1, CDKN2A, CHEK2, DICER1, EPCAM, GREM1, KIT, MEN1, MLH1, MSH2, MSH6, MUTYH, NBN, NF1, PALB2, PDGFRA, PMS2, POLD1, POLE, PTEN, RAD50, RAD51C, RAD51D, SDHA, SDHB, SDHC, SDHD, SMAD4, SMARCA4, STK11, TP53, TSC1, TSC2, and VHL   10/22/2015 Oncotype testing   Recurrence score: 13; 9% ROR (low-risk)   10/31/2015 Surgery   Right lumpectomy Donne Hazel): Invasive ductal carcinoma, grade 2, 1.5 cm, with DCIS, margins negative, 0/4 lymph nodes negative, T1 cN0 stage IA pathological stage, ER 90%, PR 90%, HER-2 negative, Ki-67 10%, Oncotype DX score 13, 9% ROR   12/11/2015 - 01/25/2016 Radiation Therapy   Adjuvant radiation therapy Lisbeth Renshaw). Right breast: 50.4 Gy in 28 fractions. Right breast boost: 10 Gy in 5 fractions.    02/2016 -  Anti-estrogen oral therapy   Tamoxifen 20 mg daily. Planned duration of treatment: 5 years      CHIEF COMPLIANT: Follow-up of right breast cancer on tamoxifen  INTERVAL HISTORY: Dorothy Gallagher is a 49 y.o. with  above-mentioned history of right breast cancer treated with lumpectomy, radiation, and who is currently on tamoxifen. Mammogram on 11/18/18 showed no evidence of malignancy bilaterally. She presents to the clinic today for annual follow-up.   ALLERGIES:  has No Known Allergies.  MEDICATIONS:  Current Outpatient Medications  Medication Sig Dispense Refill  . ranitidine (ZANTAC) 150 MG tablet Take 150 mg by mouth 2 (two) times daily.    . tamoxifen (NOLVADEX) 20 MG tablet TAKE 1 TABLET DAILY 90 tablet 3  . valACYclovir (VALTREX) 1000 MG tablet Take 1 tablet (1,000 mg total) by mouth as needed.    . venlafaxine XR (EFFEXOR-XR) 37.5 MG 24 hr capsule TAKE 1 CAPSULE(37.5 MG) BY MOUTH DAILY WITH BREAKFAST 90 capsule 1   No current facility-administered medications for this visit.    PHYSICAL EXAMINATION: ECOG PERFORMANCE STATUS: 1 - Symptomatic but completely ambulatory  Vitals:   10/04/19 1033  BP: 138/85  Pulse: 74  Resp: 16  Temp: 98.3 F (36.8 C)  SpO2: 100%   Filed Weights   10/04/19 1033  Weight: 192 lb (87.1 kg)    BREAST: No palpable masses or nodules in either right or left breasts. No palpable axillary supraclavicular or infraclavicular adenopathy no breast tenderness or nipple discharge. (exam performed in the presence of a chaperone)  LABORATORY DATA:  I have reviewed the data as listed CMP Latest Ref Rng & Units 10/10/2015  Glucose 70 - 140 mg/dl 97  BUN 7.0 - 26.0 mg/dL 12.4  Creatinine 0.6 - 1.1 mg/dL 0.8  Sodium 136 - 145 mEq/L 141  Potassium 3.5 - 5.1 mEq/L 4.2  CO2 22 - 29 mEq/L 25  Calcium 8.4 - 10.4 mg/dL 9.6  Total Protein 6.4 - 8.3 g/dL 7.4  Total Bilirubin 0.20 - 1.20 mg/dL <0.30  Alkaline Phos 40 - 150 U/L 68  AST 5 - 34 U/L 14  ALT 0 - 55 U/L 13    Lab Results  Component Value Date   WBC 7.3 10/10/2015   HGB 12.6 10/10/2015   HCT 37.2 10/10/2015   MCV 85.7 10/10/2015   PLT 304 10/10/2015   NEUTROABS 4.2 10/10/2015    ASSESSMENT & PLAN:    Breast cancer of upper-outer quadrant of right female breast ([place]) Right lumpectomy 10/31/2015: Invasive ductal carcinoma, grade 2, 1.5 cm, with DCIS, margins negative, 0/4 lymph nodes negative, T1 cN0 stage IA pathological stage, ER 90%, PR 90%, HER-2 negative, Ki-67 10%, Oncotype DX score 13, 9% risk of recurrence, low risk Adjuvant radiation therapy from 12/11/2015 to 01/25/2016   Current treatment: Adjuvant antiestrogen therapy with tamoxifen 20 mg daily started 02/11/2016  Tamoxifen toxicities: 1. Occasional hot flashes: Stable.Occasional muscle cramps: I sent a prescription for Voltaren to be used as needed and sparingly. 3.Profound depression: On Effexor 37.5 mg started 05/17/18: Remarkable improvement in her symptoms.  She discontinued it briefly.  She is starting to get more emotional now.  I instructed her to take it 3 times a week.  Breast cancer surveillance: 1. Mammograms: 11/18/2018: Benign 2. Breast Exams: 10/04/2019: Benign  RTC in 1 year for follow ups    No orders of the defined types were placed in this encounter.  The patient has a good understanding of the overall plan. she agrees with it. she will call with any problems that may develop before the next visit here.  Total time spent: 20 mins including face to face time and time spent for planning, charting and coordination of care  Nicholas Lose, MD 10/04/2019  I, Cloyde Reams Dorshimer, am acting as scribe for Dr. Nicholas Lose.  I have reviewed the above documentation for accuracy and completeness, and I agree with the above.

## 2019-10-04 ENCOUNTER — Inpatient Hospital Stay: Payer: 59 | Attending: Hematology and Oncology | Admitting: Hematology and Oncology

## 2019-10-04 ENCOUNTER — Other Ambulatory Visit: Payer: Self-pay

## 2019-10-04 DIAGNOSIS — F329 Major depressive disorder, single episode, unspecified: Secondary | ICD-10-CM | POA: Insufficient documentation

## 2019-10-04 DIAGNOSIS — Z79810 Long term (current) use of selective estrogen receptor modulators (SERMs): Secondary | ICD-10-CM | POA: Diagnosis not present

## 2019-10-04 DIAGNOSIS — T451X5A Adverse effect of antineoplastic and immunosuppressive drugs, initial encounter: Secondary | ICD-10-CM | POA: Diagnosis not present

## 2019-10-04 DIAGNOSIS — R232 Flushing: Secondary | ICD-10-CM | POA: Insufficient documentation

## 2019-10-04 DIAGNOSIS — C50411 Malignant neoplasm of upper-outer quadrant of right female breast: Secondary | ICD-10-CM | POA: Diagnosis not present

## 2019-10-04 DIAGNOSIS — Z170 Estrogen receptor positive status [ER+]: Secondary | ICD-10-CM

## 2019-10-04 DIAGNOSIS — Z79899 Other long term (current) drug therapy: Secondary | ICD-10-CM | POA: Diagnosis not present

## 2019-10-04 DIAGNOSIS — Z923 Personal history of irradiation: Secondary | ICD-10-CM | POA: Insufficient documentation

## 2019-10-04 DIAGNOSIS — R252 Cramp and spasm: Secondary | ICD-10-CM | POA: Diagnosis not present

## 2019-10-04 MED ORDER — TAMOXIFEN CITRATE 20 MG PO TABS: 20.0000 mg | ORAL_TABLET | Freq: Every day | ORAL | 3 refills | Status: DC

## 2019-10-04 MED ORDER — DICLOFENAC SODIUM 25 MG PO TBEC: 25.0000 mg | DELAYED_RELEASE_TABLET | Freq: Every day | ORAL | 3 refills | Status: DC | PRN

## 2019-10-04 NOTE — Assessment & Plan Note (Signed)
Right lumpectomy 10/31/2015: Invasive ductal carcinoma, grade 2, 1.5 cm, with DCIS, margins negative, 0/4 lymph nodes negative, T1 cN0 stage IA pathological stage, ER 90%, PR 90%, HER-2 negative, Ki-67 10%, Oncotype DX score 13, 9% risk of recurrence, low risk Adjuvant radiation therapy from 12/11/2015 to 01/25/2016   Current treatment: Adjuvant antiestrogen therapy with tamoxifen 20 mg daily started 02/11/2016  Tamoxifen toxicities: 1. Occasional hot flashes: Then not bothering her. 2.Occasional muscle cramps 3.Profound depression: On Effexor 37.5 mg started 05/17/18: Remarkable improvement in her symptoms and she will continue on with Effexor at the same dosage.  Patient appears to be overwhelmed because her husband is living out of town and her son is getting married and she had a lot on her plate.  Breast cancer surveillance: 1. Mammograms: 11/18/2018: Benign 2. Breast Exams: 10/04/2019: Benign  RTC in 1 year for follow ups

## 2019-10-05 ENCOUNTER — Telehealth: Payer: Self-pay | Admitting: Hematology and Oncology

## 2019-10-05 NOTE — Telephone Encounter (Signed)
I left a message regarding schedule  

## 2019-10-24 ENCOUNTER — Other Ambulatory Visit: Payer: Self-pay | Admitting: Hematology and Oncology

## 2019-10-24 DIAGNOSIS — Z9889 Other specified postprocedural states: Secondary | ICD-10-CM

## 2019-11-25 ENCOUNTER — Other Ambulatory Visit: Payer: Self-pay

## 2019-11-25 ENCOUNTER — Ambulatory Visit
Admission: RE | Admit: 2019-11-25 | Discharge: 2019-11-25 | Disposition: A | Discharge status: Home / Self Care | Payer: 59 | Source: Ambulatory Visit | Attending: Hematology and Oncology | Admitting: Hematology and Oncology

## 2019-11-25 DIAGNOSIS — Z9889 Other specified postprocedural states: Secondary | ICD-10-CM

## 2020-03-12 ENCOUNTER — Telehealth: Payer: Self-pay | Admitting: Hematology and Oncology

## 2020-03-12 NOTE — Telephone Encounter (Signed)
Rescheduled per 8/5 provider message. Patient is aware of updated appointment date and time.

## 2020-10-03 ENCOUNTER — Ambulatory Visit: Payer: 59 | Admitting: Hematology and Oncology

## 2020-10-03 NOTE — Progress Notes (Signed)
Patient Care Team: Tisovec, Fransico Him, MD as PCP - General (Internal Medicine) Rolm Bookbinder, MD as Consulting Physician (General Surgery) Nicholas Lose, MD as Consulting Physician (Hematology and Oncology) Kyung Rudd, MD as Consulting Physician (Radiation Oncology) Sylvan Cheese, NP as Nurse Practitioner (Hematology and Oncology)  DIAGNOSIS:    ICD-10-CM   1. Malignant neoplasm of upper-outer quadrant of right breast in female, estrogen receptor positive ([place])  C50.411    Z17.0     SUMMARY OF ONCOLOGIC HISTORY: Oncology History  Breast cancer of upper-outer quadrant of right female breast ([place])  10/05/2015 Initial Diagnosis   Right breast mass screening detected 10:00: 1.4 x 1.2 x 0.9 cm, ultrasound biopsy: Grade 2 invasive ductal carcinoma with DCIS ER/PR positive HER-2 negative Ki-67 10%, T1 cN0 stage IA clinical stage   10/11/2015 Procedure   Genetic testing: Negative. Genes evaluated: APC, ATM, AXIN2, BARD1, BMPR1A, BRCA1, BRCA2, BRIP1, CDH1, CDKN2A, CHEK2, DICER1, EPCAM, GREM1, KIT, MEN1, MLH1, MSH2, MSH6, MUTYH, NBN, NF1, PALB2, PDGFRA, PMS2, POLD1, POLE, PTEN, RAD50, RAD51C, RAD51D, SDHA, SDHB, SDHC, SDHD, SMAD4, SMARCA4, STK11, TP53, TSC1, TSC2, and VHL   10/22/2015 Oncotype testing   Recurrence score: 13; 9% ROR (low-risk)   10/31/2015 Surgery   Right lumpectomy Donne Hazel): Invasive ductal carcinoma, grade 2, 1.5 cm, with DCIS, margins negative, 0/4 lymph nodes negative, T1 cN0 stage IA pathological stage, ER 90%, PR 90%, HER-2 negative, Ki-67 10%, Oncotype DX score 13, 9% ROR   12/11/2015 - 01/25/2016 Radiation Therapy   Adjuvant radiation therapy Lisbeth Renshaw). Right breast: 50.4 Gy in 28 fractions. Right breast boost: 10 Gy in 5 fractions.    02/2016 -  Anti-estrogen oral therapy   Tamoxifen 20 mg daily. Planned duration of treatment: 5 years      CHIEF COMPLIANT: Follow-up ofright breast cancer on tamoxifen  INTERVAL HISTORY: Dorothy Gallagher is a 50 y.o. with  above-mentioned history of right breast cancertreated with lumpectomy, radiation, and who iscurrently on tamoxifen. Mammogram on 11/25/19 showed no evidence of malignancy bilaterally. She presents to the clinic today for annual follow-up.  Her major complaint today is a joint stiffness and achiness especially in the shoulders and hips.  Hot flashes have been very minimal.  Depression is no longer a problem.  She was able to come off Effexor.  ALLERGIES:  has No Known Allergies.  MEDICATIONS:  Current Outpatient Medications  Medication Sig Dispense Refill  . diclofenac (VOLTAREN) 25 MG EC tablet Take 1 tablet (25 mg total) by mouth daily as needed. 30 tablet 3  . ranitidine (ZANTAC) 150 MG tablet Take 150 mg by mouth 2 (two) times daily.    . tamoxifen (NOLVADEX) 20 MG tablet Take 1 tablet (20 mg total) by mouth daily. 90 tablet 3  . valACYclovir (VALTREX) 1000 MG tablet Take 1 tablet (1,000 mg total) by mouth as needed.    . venlafaxine XR (EFFEXOR-XR) 37.5 MG 24 hr capsule TAKE 1 CAPSULE(37.5 MG) BY MOUTH DAILY WITH BREAKFAST 90 capsule 1   No current facility-administered medications for this visit.    PHYSICAL EXAMINATION: ECOG PERFORMANCE STATUS: 1 - Symptomatic but completely ambulatory  Vitals:   10/04/20 1027  BP: 130/87  Pulse: 70  Resp: 18  Temp: 97.9 F (36.6 C)  SpO2: 100%   Filed Weights   10/04/20 1027  Weight: 202 lb 1.6 oz (91.7 kg)    BREAST: No palpable masses or nodules in either right or left breasts. No palpable axillary supraclavicular or infraclavicular adenopathy no breast tenderness  or nipple discharge. (exam performed in the presence of a chaperone)  LABORATORY DATA:  I have reviewed the data as listed CMP Latest Ref Rng & Units 10/10/2015  Glucose 70 - 140 mg/dl 97  BUN 7.0 - 26.0 mg/dL 12.4  Creatinine 0.6 - 1.1 mg/dL 0.8  Sodium 136 - 145 mEq/L 141  Potassium 3.5 - 5.1 mEq/L 4.2  CO2 22 - 29 mEq/L 25  Calcium 8.4 - 10.4 mg/dL 9.6  Total Protein  6.4 - 8.3 g/dL 7.4  Total Bilirubin 0.20 - 1.20 mg/dL <0.30  Alkaline Phos 40 - 150 U/L 68  AST 5 - 34 U/L 14  ALT 0 - 55 U/L 13    Lab Results  Component Value Date   WBC 7.3 10/10/2015   HGB 12.6 10/10/2015   HCT 37.2 10/10/2015   MCV 85.7 10/10/2015   PLT 304 10/10/2015   NEUTROABS 4.2 10/10/2015    ASSESSMENT & PLAN:  Breast cancer of upper-outer quadrant of right female breast ([place]) Right lumpectomy 10/31/2015: Invasive ductal carcinoma, grade 2, 1.5 cm, with DCIS, margins negative, 0/4 lymph nodes negative, T1 cN0 stage IA pathological stage, ER 90%, PR 90%, HER-2 negative, Ki-67 10%, Oncotype DX score 13, 9% risk of recurrence, low risk Adjuvant radiation therapy from 12/11/2015 to 01/25/2016   Current treatment: Adjuvant antiestrogen therapy with tamoxifen 20 mg daily started 02/11/2016 We discussed the pros and cons of continuing tamoxifen for full 10 years versus doing breast cancer index. Patient tells me that she is willing to continue on for the time being.  If her joint symptoms get markedly worse then she may rethink that.  So at this time we are not sending for breast cancer index.  Tamoxifen toxicities: 1. Occasional hot flashes: Stable. 2. joint stiffness: I discussed with her about doing yoga and stretching every day use her symptoms.Marland Kitchen 3.Profound depression:She came off Effexor and she appears to be doing very well.  Breast cancer surveillance: 1. Mammograms:  11/25/2019: Benign 2. Breast Exams:  10/04/2020: Benign  RTC in 1 year for follow up    No orders of the defined types were placed in this encounter.  The patient has a good understanding of the overall plan. she agrees with it. she will call with any problems that may develop before the next visit here.  Total time spent: 20 mins including face to face time and time spent for planning, charting and coordination of care  Rulon Eisenmenger, MD, MPH 10/04/2020  I, Cloyde Reams Dorshimer, am acting as  scribe for Dr. Nicholas Lose.  I have reviewed the above documentation for accuracy and completeness, and I agree with the above.

## 2020-10-04 ENCOUNTER — Other Ambulatory Visit: Payer: Self-pay

## 2020-10-04 ENCOUNTER — Inpatient Hospital Stay: Payer: 59 | Attending: Hematology and Oncology | Admitting: Hematology and Oncology

## 2020-10-04 DIAGNOSIS — M2560 Stiffness of unspecified joint, not elsewhere classified: Secondary | ICD-10-CM | POA: Diagnosis not present

## 2020-10-04 DIAGNOSIS — Z170 Estrogen receptor positive status [ER+]: Secondary | ICD-10-CM | POA: Insufficient documentation

## 2020-10-04 DIAGNOSIS — M25551 Pain in right hip: Secondary | ICD-10-CM | POA: Diagnosis not present

## 2020-10-04 DIAGNOSIS — R232 Flushing: Secondary | ICD-10-CM | POA: Insufficient documentation

## 2020-10-04 DIAGNOSIS — Z79899 Other long term (current) drug therapy: Secondary | ICD-10-CM | POA: Diagnosis not present

## 2020-10-04 DIAGNOSIS — C50411 Malignant neoplasm of upper-outer quadrant of right female breast: Secondary | ICD-10-CM

## 2020-10-04 DIAGNOSIS — Z79810 Long term (current) use of selective estrogen receptor modulators (SERMs): Secondary | ICD-10-CM | POA: Diagnosis not present

## 2020-10-04 DIAGNOSIS — M25552 Pain in left hip: Secondary | ICD-10-CM | POA: Insufficient documentation

## 2020-10-04 MED ORDER — TAMOXIFEN CITRATE 20 MG PO TABS: 20.0000 mg | ORAL_TABLET | Freq: Every day | ORAL | 3 refills | Status: DC

## 2020-10-04 NOTE — Assessment & Plan Note (Signed)
Right lumpectomy 10/31/2015: Invasive ductal carcinoma, grade 2, 1.5 cm, with DCIS, margins negative, 0/4 lymph nodes negative, T1 cN0 stage IA pathological stage, ER 90%, PR 90%, HER-2 negative, Ki-67 10%, Oncotype DX score 13, 9% risk of recurrence, low risk Adjuvant radiation therapy from 12/11/2015 to 01/25/2016   Current treatment: Adjuvant antiestrogen therapy with tamoxifen 20 mg daily started 02/11/2016  Tamoxifen toxicities: 1. Occasional hot flashes: Stable.Occasional muscle cramps: I sent a prescription for Voltaren to be used as needed and sparingly. 3.Profound depression:On Effexor 37.5 mg started 05/17/18: Remarkable improvement in her symptoms.  She discontinued it briefly.  She is starting to get more emotional now.  I instructed her to take it 3 times a week.  Breast cancer surveillance: 1. Mammograms:  11/25/2019: Benign 2. Breast Exams:  10/04/2020: Benign  RTC in 1 year for follow ups

## 2021-02-12 ENCOUNTER — Other Ambulatory Visit: Payer: Self-pay | Admitting: Hematology and Oncology

## 2021-02-12 DIAGNOSIS — Z1231 Encounter for screening mammogram for malignant neoplasm of breast: Secondary | ICD-10-CM

## 2021-02-13 ENCOUNTER — Telehealth: Payer: Self-pay

## 2021-02-13 MED ORDER — VENLAFAXINE HCL ER 37.5 MG PO CP24: 37.5000 mg | ORAL_CAPSULE | Freq: Every day | ORAL | 0 refills | Status: DC

## 2021-02-13 NOTE — Telephone Encounter (Signed)
Patient called to request refill on Effexor 37.5mg .  Patient with history of breast cancer, and profound depression.    Patient reports that she discontinued Effexor, however is now having symptoms of depression as she did previously.  Pt feelings irritable and decrease in appetite. Pt denies any suicidal ideations, or feelings of wanting to harm others.    RN reviewed with MD - MD recommendations to re-start Effexor 37.5mg  and follow up in 1 month.  Patient does not have PCP - RN encouraged patient to obtain PCP for further management as well.

## 2021-02-18 ENCOUNTER — Telehealth: Payer: Self-pay | Admitting: Hematology and Oncology

## 2021-02-18 ENCOUNTER — Other Ambulatory Visit: Payer: Self-pay

## 2021-02-18 ENCOUNTER — Ambulatory Visit
Admission: RE | Admit: 2021-02-18 | Discharge: 2021-02-18 | Disposition: A | Discharge status: Home / Self Care | Payer: 59 | Source: Ambulatory Visit | Attending: Hematology and Oncology | Admitting: Hematology and Oncology

## 2021-02-18 DIAGNOSIS — Z1231 Encounter for screening mammogram for malignant neoplasm of breast: Secondary | ICD-10-CM

## 2021-02-18 NOTE — Telephone Encounter (Signed)
Scheduled appointment per 07/15 sch msg. Patient is aware. 

## 2021-03-27 NOTE — Assessment & Plan Note (Signed)
Right lumpectomy 10/31/2015: Invasive ductal carcinoma, grade 2, 1.5 cm, with DCIS, margins negative, 0/4 lymph nodes negative, T1 cN0 stage IA pathological stage, ER 90%, PR 90%, HER-2 negative, Ki-67 10%, Oncotype DX score 13, 9% risk of recurrence, low risk Adjuvant radiation therapy from 12/11/2015 to 01/25/2016   Current treatment: Adjuvant antiestrogen therapy with tamoxifen 20 mg daily started 02/11/2016 We discussed the pros and cons of continuing tamoxifen for full 10 years versus doing breast cancer index. Patient tells me that she is willing to continue on for the time being.  If her joint symptoms get markedly worse then she may rethink that.  So at this time we are not sending for breast cancer index.  Tamoxifen toxicities: 1. Occasional hot flashes:Stable. 2. joint stiffness: I discussed with her about doing yoga and stretching every day use her symptoms.Marland Kitchen 3.Profound depression:She came off Effexor and she appears to be doing very well.  Breast cancer surveillance: 1. Mammograms: 02/20/21: Benign 2. Breast Exams: 03/12/2021: Benign  RTC in 1 year for follow up

## 2021-03-27 NOTE — Progress Notes (Signed)
Patient Care Team: Tisovec, Fransico Him, MD as PCP - General (Internal Medicine) Rolm Bookbinder, MD as Consulting Physician (General Surgery) Nicholas Lose, MD as Consulting Physician (Hematology and Oncology) Kyung Rudd, MD as Consulting Physician (Radiation Oncology) Sylvan Cheese, NP as Nurse Practitioner (Hematology and Oncology)  DIAGNOSIS:    ICD-10-CM   1. Malignant neoplasm of upper-outer quadrant of right breast in female, estrogen receptor positive ([place])  C50.411    Z17.0       SUMMARY OF ONCOLOGIC HISTORY: Oncology History  Breast cancer of upper-outer quadrant of right female breast ([place])  10/05/2015 Initial Diagnosis   Right breast mass screening detected 10:00: 1.4 x 1.2 x 0.9 cm, ultrasound biopsy: Grade 2 invasive ductal carcinoma with DCIS ER/PR positive HER-2 negative Ki-67 10%, T1 cN0 stage IA clinical stage   10/11/2015 Procedure   Genetic testing: Negative. Genes evaluated: APC, ATM, AXIN2, BARD1, BMPR1A, BRCA1, BRCA2, BRIP1, CDH1, CDKN2A, CHEK2, DICER1, EPCAM, GREM1, KIT, MEN1, MLH1, MSH2, MSH6, MUTYH, NBN, NF1, PALB2, PDGFRA, PMS2, POLD1, POLE, PTEN, RAD50, RAD51C, RAD51D, SDHA, SDHB, SDHC, SDHD, SMAD4, SMARCA4, STK11, TP53, TSC1, TSC2, and VHL   10/22/2015 Oncotype testing   Recurrence score: 13; 9% ROR (low-risk)   10/31/2015 Surgery   Right lumpectomy Donne Hazel): Invasive ductal carcinoma, grade 2, 1.5 cm, with DCIS, margins negative, 0/4 lymph nodes negative, T1 cN0 stage IA pathological stage, ER 90%, PR 90%, HER-2 negative, Ki-67 10%, Oncotype DX score 13, 9% ROR   12/11/2015 - 01/25/2016 Radiation Therapy   Adjuvant radiation therapy Lisbeth Renshaw). Right breast: 50.4 Gy in 28 fractions. Right breast boost: 10 Gy in 5 fractions.    02/2016 -  Anti-estrogen oral therapy   Tamoxifen 20 mg daily. Planned duration of treatment: 5 years      CHIEF COMPLIANT: Follow-up of right breast cancer on tamoxifen  INTERVAL HISTORY: Dorothy Gallagher is a 50 y.o.  with above-mentioned history of right breast cancer treated with lumpectomy, radiation, and who is currently on tamoxifen. Mammogram on 02/18/21 showed no evidence of malignancy bilaterally. She presents to the clinic today for annual follow-up.  Her major complaint today is tremors which she has noticed appear to be getting worse since she started Effexor.  She also thinks that the hot flashes have gotten worse.  Her depression and mood swings have improved significantly.  She feels stiff in her joints especially of the right shoulder.  ALLERGIES:  has No Known Allergies.  MEDICATIONS:  Current Outpatient Medications  Medication Sig Dispense Refill   diclofenac (VOLTAREN) 25 MG EC tablet Take 1 tablet (25 mg total) by mouth daily as needed. 30 tablet 3   ranitidine (ZANTAC) 150 MG tablet Take 150 mg by mouth 2 (two) times daily.     tamoxifen (NOLVADEX) 20 MG tablet Take 1 tablet (20 mg total) by mouth daily. 90 tablet 3   valACYclovir (VALTREX) 1000 MG tablet Take 1 tablet (1,000 mg total) by mouth as needed.     venlafaxine XR (EFFEXOR-XR) 37.5 MG 24 hr capsule Take 1 capsule (37.5 mg total) by mouth daily with breakfast. 90 capsule 0   No current facility-administered medications for this visit.    PHYSICAL EXAMINATION: ECOG PERFORMANCE STATUS: 1 - Symptomatic but completely ambulatory  Vitals:   03/28/21 1208  BP: (!) 143/80  Pulse: 94  Resp: 18  Temp: (!) 97.5 F (36.4 C)  SpO2: 99%   Filed Weights   03/28/21 1208  Weight: 198 lb 12.8 oz (90.2 kg)  BREAST: No palpable masses or nodules in either right or left breasts. No palpable axillary supraclavicular or infraclavicular adenopathy no breast tenderness or nipple discharge. (exam performed in the presence of a chaperone)  LABORATORY DATA:  I have reviewed the data as listed CMP Latest Ref Rng & Units 10/10/2015  Glucose 70 - 140 mg/dl 97  BUN 7.0 - 26.0 mg/dL 12.4  Creatinine 0.6 - 1.1 mg/dL 0.8  Sodium 136 - 145 mEq/L  141  Potassium 3.5 - 5.1 mEq/L 4.2  CO2 22 - 29 mEq/L 25  Calcium 8.4 - 10.4 mg/dL 9.6  Total Protein 6.4 - 8.3 g/dL 7.4  Total Bilirubin 0.20 - 1.20 mg/dL <0.30  Alkaline Phos 40 - 150 U/L 68  AST 5 - 34 U/L 14  ALT 0 - 55 U/L 13    Lab Results  Component Value Date   WBC 7.3 10/10/2015   HGB 12.6 10/10/2015   HCT 37.2 10/10/2015   MCV 85.7 10/10/2015   PLT 304 10/10/2015   NEUTROABS 4.2 10/10/2015    ASSESSMENT & PLAN:  Breast cancer of upper-outer quadrant of right female breast ([place]) Right lumpectomy 10/31/2015: Invasive ductal carcinoma, grade 2, 1.5 cm, with DCIS, margins negative, 0/4 lymph nodes negative, T1 cN0 stage IA pathological stage, ER 90%, PR 90%, HER-2 negative, Ki-67 10%, Oncotype DX score 13, 9% risk of recurrence, low risk Adjuvant radiation therapy from 12/11/2015 to 01/25/2016    Current treatment: Adjuvant antiestrogen therapy with tamoxifen 20 mg daily started 02/11/2016 We discussed the pros and cons of continuing tamoxifen for full 10 years versus doing breast cancer index. Patient tells me that she is willing to continue on for the time being.  If her joint symptoms get markedly worse then she may rethink that.  So at this time we are not sending for breast cancer index.   Tamoxifen toxicities: 1.  Severe hot flashes 2. joint stiffness:. 3.  Profound depression: And mood swings significant improved with Effexor We will send for breast cancer index to determine if she would benefit from extended adjuvant therapy.  Tremors: Unclear etiology.  We discussed about using propranolol if she finds it uncontrollable.   Breast cancer surveillance: 1. Mammograms:  02/20/21: Benign 2. Breast Exams:  03/12/2021: Benign   I will call her in 1 month to discuss results of breast cancer index.  At that time she will inform me whether the tremors are any better by taking the Effexor at bedtime.  RTC in 1 year for follow up      No orders of the defined types  were placed in this encounter.  The patient has a good understanding of the overall plan. she agrees with it. she will call with any problems that may develop before the next visit here.  Total time spent: 20 mins including face to face time and time spent for planning, charting and coordination of care  Rulon Eisenmenger, MD, MPH 03/28/2021  I, Thana Ates, am acting as scribe for Dr. Nicholas Lose.  I have reviewed the above documentation for accuracy and completeness, and I agree with the above.

## 2021-03-28 ENCOUNTER — Inpatient Hospital Stay: Payer: 59 | Attending: Hematology and Oncology | Admitting: Hematology and Oncology

## 2021-03-28 ENCOUNTER — Other Ambulatory Visit: Payer: Self-pay

## 2021-03-28 DIAGNOSIS — R251 Tremor, unspecified: Secondary | ICD-10-CM | POA: Insufficient documentation

## 2021-03-28 DIAGNOSIS — Z79810 Long term (current) use of selective estrogen receptor modulators (SERMs): Secondary | ICD-10-CM | POA: Diagnosis not present

## 2021-03-28 DIAGNOSIS — Z79899 Other long term (current) drug therapy: Secondary | ICD-10-CM | POA: Insufficient documentation

## 2021-03-28 DIAGNOSIS — F32A Depression, unspecified: Secondary | ICD-10-CM | POA: Diagnosis not present

## 2021-03-28 DIAGNOSIS — M2560 Stiffness of unspecified joint, not elsewhere classified: Secondary | ICD-10-CM | POA: Diagnosis not present

## 2021-03-28 DIAGNOSIS — Z170 Estrogen receptor positive status [ER+]: Secondary | ICD-10-CM | POA: Diagnosis not present

## 2021-03-28 DIAGNOSIS — C50411 Malignant neoplasm of upper-outer quadrant of right female breast: Secondary | ICD-10-CM | POA: Diagnosis present

## 2021-03-28 DIAGNOSIS — R232 Flushing: Secondary | ICD-10-CM | POA: Diagnosis not present

## 2021-04-01 ENCOUNTER — Telehealth: Payer: Self-pay | Admitting: Hematology and Oncology

## 2021-04-01 NOTE — Telephone Encounter (Signed)
Scheduled per 8/25 los. Called and spoke with pt confirmed 9/26 appt

## 2021-04-02 ENCOUNTER — Ambulatory Visit: Payer: 59 | Admitting: Hematology and Oncology

## 2021-04-03 ENCOUNTER — Telehealth: Payer: Self-pay | Admitting: *Deleted

## 2021-04-03 NOTE — Telephone Encounter (Signed)
Ordered [facility] per Dr. Lindi Adie.  Faxed requisition to pathology and [facility].

## 2021-04-12 ENCOUNTER — Telehealth: Payer: Self-pay | Admitting: *Deleted

## 2021-04-12 NOTE — Telephone Encounter (Signed)
Received call from pt stating [facility] would cost $350 to $900 out of pocket due to them being out of network with [facility].  Pt requesting advice from MD if [facility] is needed or if she should continue Tamoxifen despite profound hot flashes.  Per MD pt to hold off on B[facility] testing at this time and decrease tamoxifen dosage to 10 mg p.o daily.  RN notified pt and pt verbalized understanding. Pt scheduled for f/u with MD at the end of the month.

## 2021-04-15 ENCOUNTER — Other Ambulatory Visit: Payer: Self-pay | Admitting: *Deleted

## 2021-04-15 MED ORDER — VENLAFAXINE HCL ER 37.5 MG PO CP24: 37.5000 mg | ORAL_CAPSULE | Freq: Every day | ORAL | 3 refills | Status: DC

## 2021-04-27 NOTE — Progress Notes (Signed)
HEMATOLOGY-ONCOLOGY TELEPHONE VISIT PROGRESS NOTE  I connected with Dorothy Gallagher on 04/29/2021 at 11:15 AM EDT by telephone and verified that I am speaking with the correct person using two identifiers.  I discussed the limitations, risks, security and privacy concerns of performing an evaluation and management service by telephone and the availability of in person appointments.  I also discussed with the patient that there may be a patient responsible charge related to this service. The patient expressed understanding and agreed to proceed.   History of Present Illness: Dorothy Gallagher is a 50 y.o. female with above-mentioned history of right breast cancer treated with lumpectomy, radiation, and who is currently on tamoxifen. She presents via telephone today for annual follow-up.  Oncology History  Breast cancer of upper-outer quadrant of right female breast ([place])  10/05/2015 Initial Diagnosis   Right breast mass screening detected 10:00: 1.4 x 1.2 x 0.9 cm, ultrasound biopsy: Grade 2 invasive ductal carcinoma with DCIS ER/PR positive HER-2 negative Ki-67 10%, T1 cN0 stage IA clinical stage   10/11/2015 Procedure   Genetic testing: Negative. Genes evaluated: APC, ATM, AXIN2, BARD1, BMPR1A, BRCA1, BRCA2, BRIP1, CDH1, CDKN2A, CHEK2, DICER1, EPCAM, GREM1, KIT, MEN1, MLH1, MSH2, MSH6, MUTYH, NBN, NF1, PALB2, PDGFRA, PMS2, POLD1, POLE, PTEN, RAD50, RAD51C, RAD51D, SDHA, SDHB, SDHC, SDHD, SMAD4, SMARCA4, STK11, TP53, TSC1, TSC2, and VHL   10/22/2015 Oncotype testing   Recurrence score: 13; 9% ROR (low-risk)   10/31/2015 Surgery   Right lumpectomy Donne Hazel): Invasive ductal carcinoma, grade 2, 1.5 cm, with DCIS, margins negative, 0/4 lymph nodes negative, T1 cN0 stage IA pathological stage, ER 90%, PR 90%, HER-2 negative, Ki-67 10%, Oncotype DX score 13, 9% ROR   12/11/2015 - 01/25/2016 Radiation Therapy   Adjuvant radiation therapy Lisbeth Renshaw). Right breast: 50.4 Gy in 28 fractions. Right breast boost: 10  Gy in 5 fractions.    02/2016 -  Anti-estrogen oral therapy   Tamoxifen 20 mg daily. Planned duration of treatment: 5 years      Observations/Objective:     Assessment Plan:  Breast cancer of upper-outer quadrant of right female breast ([place]) Right lumpectomy 10/31/2015: Invasive ductal carcinoma, grade 2, 1.5 cm, with DCIS, margins negative, 0/4 lymph nodes negative, T1 cN0 stage IA pathological stage, ER 90%, PR 90%, HER-2 negative, Ki-67 10%, Oncotype DX score 13, 9% risk of recurrence, low risk Adjuvant radiation therapy from 12/11/2015 to 01/25/2016    Current treatment: Adjuvant antiestrogen therapy with tamoxifen 20 mg daily started 02/11/2016, decreased dosage to 10 mg on 04/12/2021 (due to severe hot flashes) With the lower dosage of tamoxifen she is significantly better.   Tamoxifen toxicities: 1.  Severe hot flashes: Improved 2. joint stiffness: Also improved 3.  Profound depression: Also much better with Effexor and reducing the dosage of tamoxifen    Tremors: Unclear etiology.  Will be monitored   Breast cancer surveillance: 1. Mammograms:  02/20/21: Benign 2. Breast Exams:  03/12/2021: Benign    RTC in 1 year for follow up    I discussed the assessment and treatment plan with the patient. The patient was provided an opportunity to ask questions and all were answered. The patient agreed with the plan and demonstrated an understanding of the instructions. The patient was advised to call back or seek an in-person evaluation if the symptoms worsen or if the condition fails to improve as anticipated.   Total time spent: 12 mins including non-face to face time and time spent for planning, charting and coordination of care  Rulon Eisenmenger, MD 04/29/2021    I, Thana Ates, am acting as scribe for Nicholas Lose, MD.  I have reviewed the above documentation for accuracy and completeness, and I agree with the above.

## 2021-04-29 ENCOUNTER — Inpatient Hospital Stay: Payer: 59 | Attending: Hematology and Oncology | Admitting: Hematology and Oncology

## 2021-04-29 DIAGNOSIS — Z170 Estrogen receptor positive status [ER+]: Secondary | ICD-10-CM

## 2021-04-29 DIAGNOSIS — C50411 Malignant neoplasm of upper-outer quadrant of right female breast: Secondary | ICD-10-CM | POA: Diagnosis not present

## 2021-04-29 NOTE — Assessment & Plan Note (Signed)
Right lumpectomy 10/31/2015: Invasive ductal carcinoma, grade 2, 1.5 cm, with DCIS, margins negative, 0/4 lymph nodes negative, T1 cN0 stage IA pathological stage, ER 90%, PR 90%, HER-2 negative, Ki-67 10%, Oncotype DX score 13, 9% risk of recurrence, low risk Adjuvant radiation therapy from 12/11/2015 to 01/25/2016   Current treatment: Adjuvant antiestrogen therapy with tamoxifen 20 mg daily started 02/11/2016, decreased dosage to 10 mg on 04/12/2021 (due to severe hot flashes)  If she cannot tolerate the 10 mg dose and will discontinue tamoxifen at this time.  Breast cancer index was very expensive and therefore it was not able to be done.      Tamoxifen toxicities: 1.  Severe hot flashes 2.joint stiffness:. 3.Profound depression:And mood swings significant improved with Effexor   Tremors: Unclear etiology.  We discussed about using propranolol if she finds it uncontrollable.  Breast cancer surveillance: 1. Mammograms:02/20/21: Benign 2. Breast Exams:03/12/2021: Benign  RTC in 1 year for follow up

## 2021-09-20 ENCOUNTER — Telehealth: Payer: Self-pay

## 2021-09-20 NOTE — Telephone Encounter (Signed)
Called and left VM, per MD moved pt 3/3 appointment to 3/13 with [facility]

## 2021-09-28 IMAGING — MG MM DIGITAL SCREENING BILAT W/ TOMO AND CAD
8 series · 8 of 24 positions shown · non-contrast
Comparison: Previous exam(s).

CLINICAL DATA: Screening.

EXAM:
DIGITAL SCREENING BILATERAL MAMMOGRAM WITH TOMOSYNTHESIS AND CAD
TECHNIQUE: Bilateral screening digital craniocaudal and mediolateral oblique
mammograms were obtained. Bilateral screening digital breast
tomosynthesis was performed. The images were evaluated with
computer-aided detection.

[L MLO synth-2D]
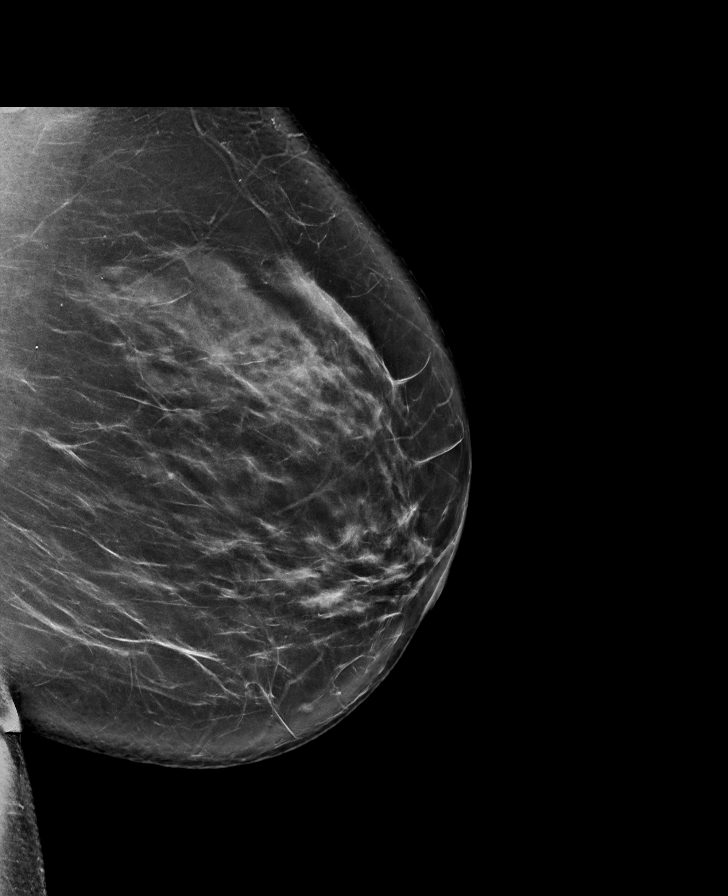

[L CC synth-2D]
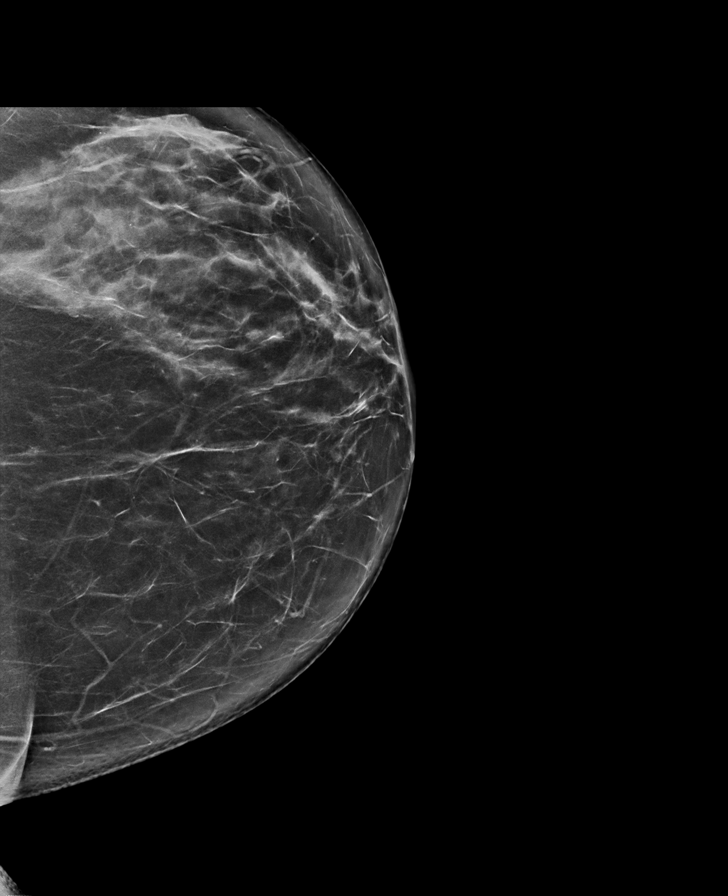

[R MLO synth-2D]
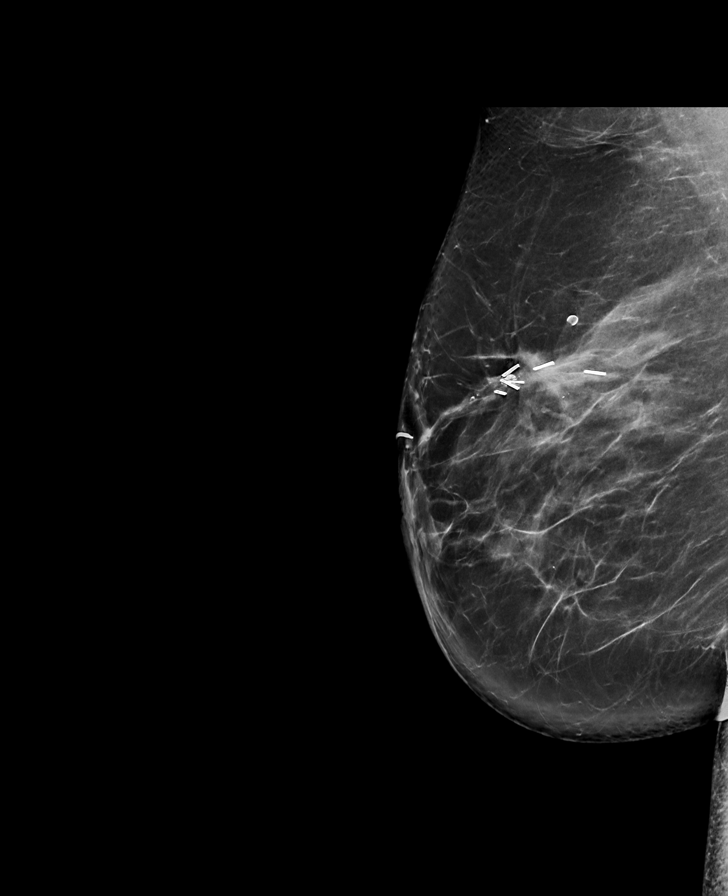

[R CC synth-2D]
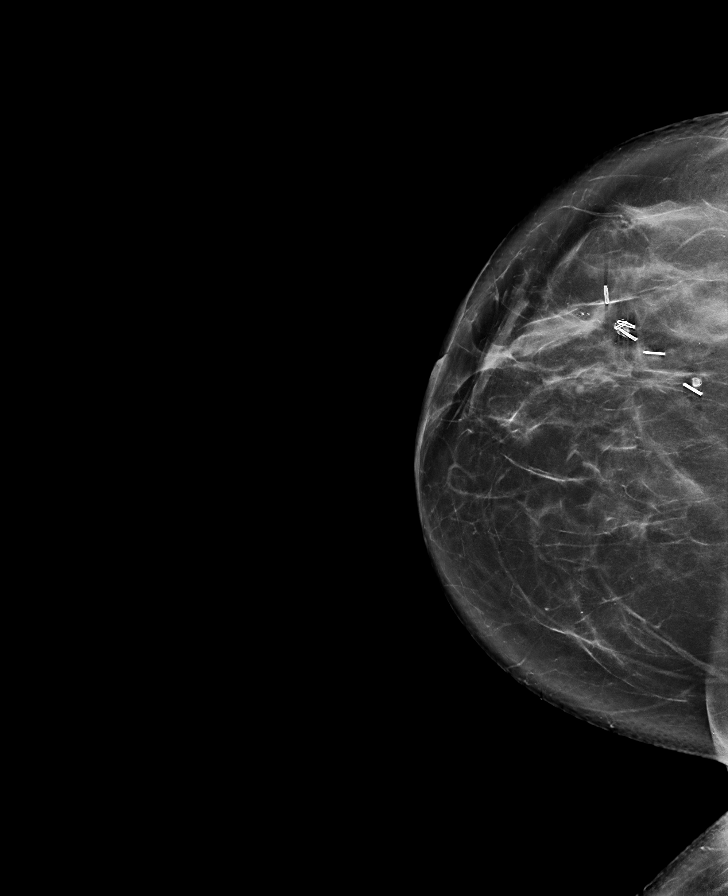

[L MLO tomo · tomo slice 47/93.0]
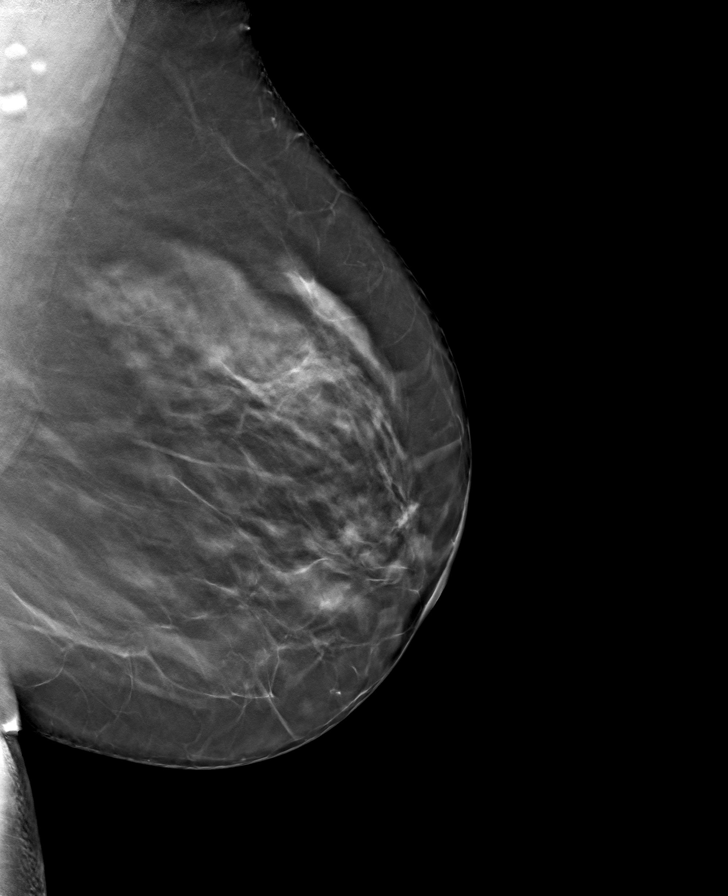

[L CC tomo · tomo slice 41/80.0]
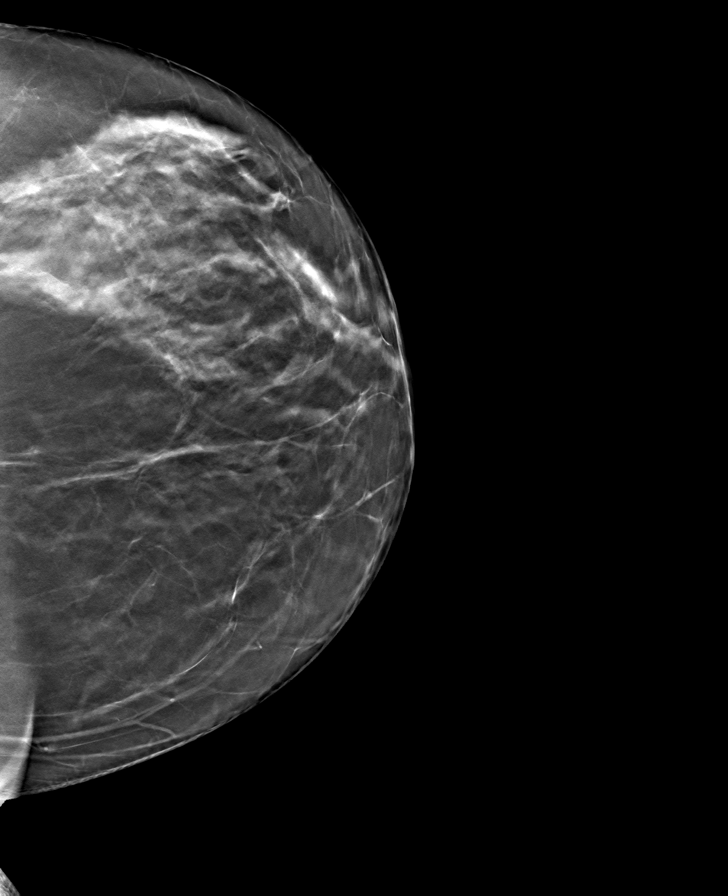

[R CC tomo · tomo slice 41/81.0]
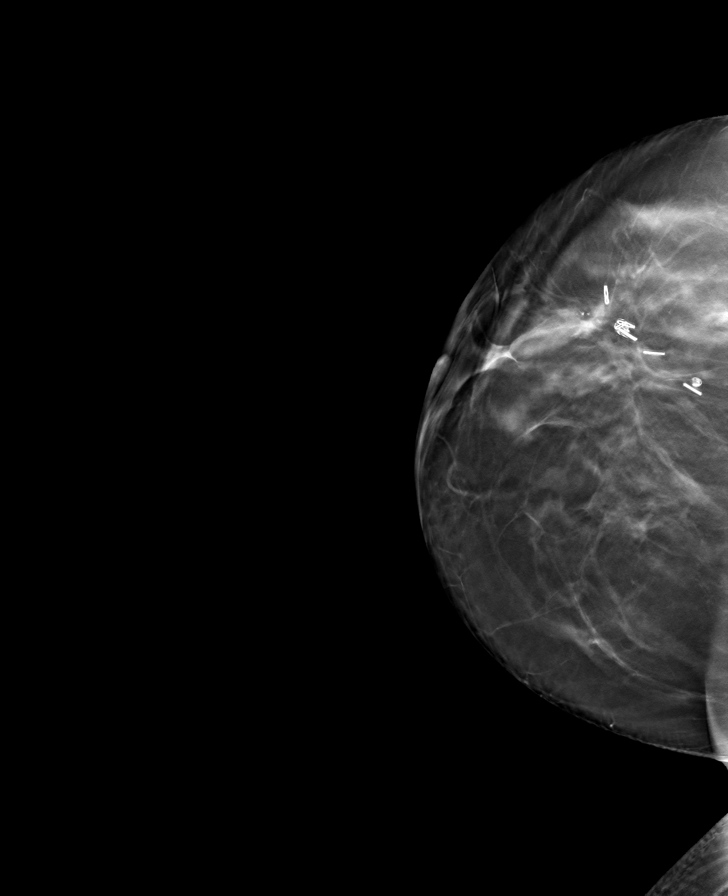

[R MLO tomo · tomo slice 47/93.0]
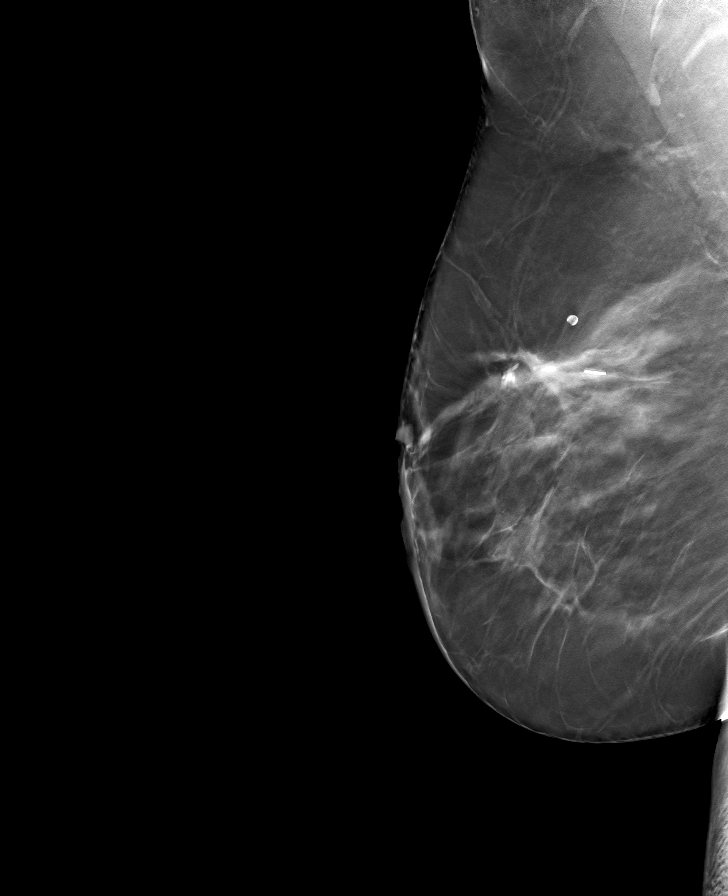

[8 of 24 positions shown; findings below may reference images not displayed]

ACR Breast Density Category c: The breast tissue is heterogeneously
dense, which may obscure small masses.
FINDINGS: There are no findings suspicious for malignancy.
IMPRESSION: No mammographic evidence of malignancy. A result letter of this
screening mammogram will be mailed directly to the patient.

RECOMMENDATION:
Screening mammogram in one year. (Code:[id number]-[id number])

BI-RADS CATEGORY  1: Negative.

## 2021-10-04 ENCOUNTER — Ambulatory Visit: Payer: 59 | Admitting: Hematology and Oncology

## 2021-10-14 ENCOUNTER — Ambulatory Visit: Payer: 59 | Admitting: Adult Health

## 2021-10-21 ENCOUNTER — Ambulatory Visit: Payer: 59 | Admitting: Adult Health

## 2021-11-14 ENCOUNTER — Other Ambulatory Visit: Payer: Self-pay | Admitting: *Deleted

## 2021-11-14 MED ORDER — TAMOXIFEN CITRATE 20 MG PO TABS: 20.0000 mg | ORAL_TABLET | Freq: Every day | ORAL | 3 refills | Status: DC

## 2021-11-18 ENCOUNTER — Other Ambulatory Visit: Payer: Self-pay | Admitting: *Deleted

## 2021-11-18 MED ORDER — TAMOXIFEN CITRATE 20 MG PO TABS: 20.0000 mg | ORAL_TABLET | Freq: Every day | ORAL | 3 refills | Status: DC

## 2021-11-18 MED ORDER — VENLAFAXINE HCL ER 37.5 MG PO CP24: 37.5000 mg | ORAL_CAPSULE | Freq: Every day | ORAL | 3 refills | Status: DC

## 2022-01-08 ENCOUNTER — Other Ambulatory Visit: Payer: Self-pay | Admitting: Hematology and Oncology

## 2022-01-08 DIAGNOSIS — Z1231 Encounter for screening mammogram for malignant neoplasm of breast: Secondary | ICD-10-CM

## 2022-02-24 ENCOUNTER — Ambulatory Visit
Admission: RE | Admit: 2022-02-24 | Discharge: 2022-02-24 | Disposition: A | Discharge status: Home / Self Care | Payer: 59 | Source: Ambulatory Visit | Attending: Hematology and Oncology | Admitting: Hematology and Oncology

## 2022-02-24 DIAGNOSIS — Z1231 Encounter for screening mammogram for malignant neoplasm of breast: Secondary | ICD-10-CM

## 2022-03-05 ENCOUNTER — Telehealth: Payer: Self-pay | Admitting: Hematology and Oncology

## 2022-03-05 NOTE — Telephone Encounter (Signed)
Rescheduled appointment per provider PAL. Patient is aware of the changes made to her upcoming appointment. 

## 2022-03-28 ENCOUNTER — Ambulatory Visit: Payer: 59 | Admitting: Hematology and Oncology

## 2022-04-04 ENCOUNTER — Inpatient Hospital Stay: Payer: 59 | Attending: Hematology and Oncology | Admitting: Hematology and Oncology

## 2022-04-04 NOTE — Assessment & Plan Note (Deleted)
Right lumpectomy 10/31/2015: Invasive ductal carcinoma, grade 2, 1.5 cm, with DCIS, margins negative, 0/4 lymph nodes negative, T1 cN0 stage IA pathological stage, ER 90%, PR 90%, HER-2 negative, Ki-67 10%, Oncotype DX score 13, 9% risk of recurrence, low risk Adjuvant radiation therapy from 12/11/2015 to 01/25/2016   Current treatment: Adjuvant antiestrogen therapy with tamoxifen 20 mg daily started 02/11/2016, decreased dosage to 10 mg on 04/12/2021 (due to severe hot flashes) With the lower dosage of tamoxifen she is significantly better.  Tamoxifen toxicities: 1.Severe hot flashes: Improved 2.joint stiffness: Also improved 3.Profound depression:Also much better with Effexor and reducing the dosage of tamoxifen   Tremors: Unclear etiology. Will be monitored  Breast cancer surveillance: 1. Mammograms: 02/25/2022: Benign 2. Breast Exams:04/04/2022: Benign  RTC in 1 year for follow up

## 2022-09-06 ENCOUNTER — Other Ambulatory Visit: Payer: Self-pay | Admitting: Hematology and Oncology

## 2022-09-25 NOTE — Progress Notes (Signed)
Patient Care Team: Tisovec, Fransico Him, MD as PCP - General (Internal Medicine) Rolm Bookbinder, MD as Consulting Physician (General Surgery) Nicholas Lose, MD as Consulting Physician (Hematology and Oncology) Kyung Rudd, MD as Consulting Physician (Radiation Oncology) Sylvan Cheese, NP as Nurse Practitioner (Hematology and Oncology)  DIAGNOSIS: No diagnosis found.  SUMMARY OF ONCOLOGIC HISTORY: Oncology History  Breast cancer of upper-outer quadrant of right female breast ([place])  10/05/2015 Initial Diagnosis   Right breast mass screening detected 10:00: 1.4 x 1.2 x 0.9 cm, ultrasound biopsy: Grade 2 invasive ductal carcinoma with DCIS ER/PR positive HER-2 negative Ki-67 10%, T1 cN0 stage IA clinical stage   10/11/2015 Procedure   Genetic testing: Negative. Genes evaluated: APC, ATM, AXIN2, BARD1, BMPR1A, BRCA1, BRCA2, BRIP1, CDH1, CDKN2A, CHEK2, DICER1, EPCAM, GREM1, KIT, MEN1, MLH1, MSH2, MSH6, MUTYH, NBN, NF1, PALB2, PDGFRA, PMS2, POLD1, POLE, PTEN, RAD50, RAD51C, RAD51D, SDHA, SDHB, SDHC, SDHD, SMAD4, SMARCA4, STK11, TP53, TSC1, TSC2, and VHL   10/22/2015 Oncotype testing   Recurrence score: 13; 9% ROR (low-risk)   10/31/2015 Surgery   Right lumpectomy Donne Hazel): Invasive ductal carcinoma, grade 2, 1.5 cm, with DCIS, margins negative, 0/4 lymph nodes negative, T1 cN0 stage IA pathological stage, ER 90%, PR 90%, HER-2 negative, Ki-67 10%, Oncotype DX score 13, 9% ROR   12/11/2015 - 01/25/2016 Radiation Therapy   Adjuvant radiation therapy Lisbeth Renshaw). Right breast: 50.4 Gy in 28 fractions. Right breast boost: 10 Gy in 5 fractions.    02/2016 -  Anti-estrogen oral therapy   Tamoxifen 20 mg daily. Planned duration of treatment: 5 years      CHIEF COMPLIANT:   INTERVAL HISTORY: Dorothy Gallagher is a  52 y.o. female with above-mentioned history of right breast cancer treated with lumpectomy, radiation, and who is currently on tamoxifen. She presents today for annual follow-up.     ALLERGIES:  has No Known Allergies.  MEDICATIONS:  Current Outpatient Medications  Medication Sig Dispense Refill   diclofenac (VOLTAREN) 25 MG EC tablet Take 1 tablet (25 mg total) by mouth daily as needed. 30 tablet 3   ranitidine (ZANTAC) 150 MG tablet Take 150 mg by mouth 2 (two) times daily.     tamoxifen (NOLVADEX) 20 MG tablet TAKE 1 TABLET BY MOUTH DAILY 90 tablet 0   valACYclovir (VALTREX) 1000 MG tablet Take 1 tablet (1,000 mg total) by mouth as needed.     venlafaxine XR (EFFEXOR-XR) 37.5 MG 24 hr capsule Take 1 capsule (37.5 mg total) by mouth daily with breakfast. 90 capsule 3   No current facility-administered medications for this visit.    PHYSICAL EXAMINATION: ECOG PERFORMANCE STATUS: {CHL ONC ECOG PS:[phone]}  There were no vitals filed for this visit. There were no vitals filed for this visit.  BREAST:*** No palpable masses or nodules in either right or left breasts. No palpable axillary supraclavicular or infraclavicular adenopathy no breast tenderness or nipple discharge. (exam performed in the presence of a chaperone)  LABORATORY DATA:  I have reviewed the data as listed    Latest Ref Rng & Units 10/10/2015   12:26 PM  CMP  Glucose 70 - 140 mg/dl 97   BUN 7.0 - 26.0 mg/dL 12.4   Creatinine 0.6 - 1.1 mg/dL 0.8   Sodium 136 - 145 mEq/L 141   Potassium 3.5 - 5.1 mEq/L 4.2   CO2 22 - 29 mEq/L 25   Calcium 8.4 - 10.4 mg/dL 9.6   Total Protein 6.4 - 8.3 g/dL 7.4   Total Bilirubin  0.20 - 1.20 mg/dL <0.30   Alkaline Phos 40 - 150 U/L 68   AST 5 - 34 U/L 14   ALT 0 - 55 U/L 13     Lab Results  Component Value Date   WBC 7.3 10/10/2015   HGB 12.6 10/10/2015   HCT 37.2 10/10/2015   MCV 85.7 10/10/2015   PLT 304 10/10/2015   NEUTROABS 4.2 10/10/2015    ASSESSMENT & PLAN:  No problem-specific Assessment & Plan notes found for this encounter.    No orders of the defined types were placed in this encounter.  The patient has a good understanding  of the overall plan. she agrees with it. she will call with any problems that may develop before the next visit here. Total time spent: 30 mins including face to face time and time spent for planning, charting and co-ordination of care   Suzzette Righter, [place] 09/25/22    I Gardiner Coins am acting as a Education administrator for [employer]  ***

## 2022-09-29 ENCOUNTER — Other Ambulatory Visit: Payer: Self-pay

## 2022-09-29 ENCOUNTER — Inpatient Hospital Stay: Payer: 59 | Attending: Hematology and Oncology | Admitting: Hematology and Oncology

## 2022-09-29 VITALS — BP 152/92 | HR 70 | Temp 97.7°F | Resp 16 | Wt 224.3 lb

## 2022-09-29 DIAGNOSIS — R232 Flushing: Secondary | ICD-10-CM | POA: Diagnosis not present

## 2022-09-29 DIAGNOSIS — F32A Depression, unspecified: Secondary | ICD-10-CM | POA: Insufficient documentation

## 2022-09-29 DIAGNOSIS — Z79899 Other long term (current) drug therapy: Secondary | ICD-10-CM | POA: Insufficient documentation

## 2022-09-29 DIAGNOSIS — Z79810 Long term (current) use of selective estrogen receptor modulators (SERMs): Secondary | ICD-10-CM | POA: Insufficient documentation

## 2022-09-29 DIAGNOSIS — R251 Tremor, unspecified: Secondary | ICD-10-CM | POA: Insufficient documentation

## 2022-09-29 DIAGNOSIS — C50411 Malignant neoplasm of upper-outer quadrant of right female breast: Secondary | ICD-10-CM | POA: Diagnosis present

## 2022-09-29 DIAGNOSIS — M2560 Stiffness of unspecified joint, not elsewhere classified: Secondary | ICD-10-CM | POA: Diagnosis not present

## 2022-09-29 DIAGNOSIS — Z170 Estrogen receptor positive status [ER+]: Secondary | ICD-10-CM | POA: Insufficient documentation

## 2022-09-29 MED ORDER — TAMOXIFEN CITRATE 10 MG PO TABS: 10.0000 mg | ORAL_TABLET | Freq: Every day | ORAL | 3 refills | Status: DC

## 2022-09-29 NOTE — Assessment & Plan Note (Addendum)
Right lumpectomy 10/31/2015: Invasive ductal carcinoma, grade 2, 1.5 cm, with DCIS, margins negative, 0/4 lymph nodes negative, T1 cN0 stage IA pathological stage, ER 90%, PR 90%, HER-2 negative, Ki-67 10%, Oncotype DX score 13, 9% risk of recurrence, low risk Adjuvant radiation therapy from 12/11/2015 to 01/25/2016    Current treatment: Adjuvant antiestrogen therapy with tamoxifen 20 mg daily started 02/11/2016, decreased dosage to 10 mg on 04/12/2021 (due to severe hot flashes) With the lower dosage of tamoxifen she is significantly better.   Tamoxifen toxicities: 1.  Severe hot flashes: Improved 2. joint stiffness: Also improved 3.  Profound depression: Also much better with Effexor and reducing the dosage of tamoxifen    Tremors: Unclear etiology.  Will be monitored   Breast cancer surveillance: 1. Mammograms: 02/25/2022: Benign 2. 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

## 2022-11-25 ENCOUNTER — Other Ambulatory Visit: Payer: Self-pay | Admitting: Hematology and Oncology

## 2023-08-06 ENCOUNTER — Other Ambulatory Visit: Payer: Self-pay | Admitting: *Deleted

## 2023-08-06 MED ORDER — VENLAFAXINE HCL ER 37.5 MG PO CP24: 37.5000 mg | ORAL_CAPSULE | Freq: Every day | ORAL | 3 refills | Status: AC

## 2023-08-06 MED ORDER — TAMOXIFEN CITRATE 10 MG PO TABS: 10.0000 mg | ORAL_TABLET | Freq: Every day | ORAL | 3 refills | Status: DC

## 2023-08-06 NOTE — Telephone Encounter (Signed)
 Received VM from pt, RN atttempt x1 to return call.  No answer, LVM

## 2023-09-03 ENCOUNTER — Other Ambulatory Visit: Payer: Self-pay | Admitting: Hematology and Oncology

## 2023-09-03 DIAGNOSIS — Z0000 Encounter for general adult medical examination without abnormal findings: Secondary | ICD-10-CM

## 2023-09-04 ENCOUNTER — Ambulatory Visit: Payer: 59

## 2023-09-22 ENCOUNTER — Ambulatory Visit
Admission: RE | Admit: 2023-09-22 | Discharge: 2023-09-22 | Disposition: A | Discharge status: Home / Self Care | Payer: BC Managed Care – PPO | Source: Ambulatory Visit | Attending: Hematology and Oncology

## 2023-09-22 DIAGNOSIS — Z0000 Encounter for general adult medical examination without abnormal findings: Secondary | ICD-10-CM

## 2023-10-05 ENCOUNTER — Inpatient Hospital Stay: Payer: BC Managed Care – PPO | Admitting: Hematology and Oncology

## 2023-10-05 NOTE — Assessment & Plan Note (Deleted)
 Right lumpectomy 10/31/2015: Invasive ductal carcinoma, grade 2, 1.5 cm, with DCIS, margins negative, 0/4 lymph nodes negative, T1 cN0 stage IA pathological stage, ER 90%, PR 90%, HER-2 negative, Ki-67 10%, Oncotype DX score 13, 9% risk of recurrence, low risk Adjuvant radiation therapy from 12/11/2015 to 01/25/2016    Current treatment: Adjuvant antiestrogen therapy with tamoxifen 20 mg daily started 02/11/2016, decreased dosage to 10 mg on 04/12/2021 (due to severe hot flashes) With the lower dosage of tamoxifen she is significantly better.   Tamoxifen toxicities: 1.  Severe hot flashes: Improved 2. joint stiffness: Also improved 3.  Profound depression: Also much better with Effexor and reducing the dosage of tamoxifen    Tremors: Unclear etiology.  Will be monitored   Breast cancer surveillance: 1. Mammograms: 09/25/2023: Benign, breast density category C 2. 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

## 2023-12-04 ENCOUNTER — Other Ambulatory Visit: Payer: Self-pay | Admitting: Gastroenterology

## 2023-12-11 ENCOUNTER — Encounter (HOSPITAL_COMMUNITY): Payer: Self-pay | Admitting: Gastroenterology

## 2023-12-18 ENCOUNTER — Other Ambulatory Visit: Payer: Self-pay

## 2023-12-18 ENCOUNTER — Ambulatory Visit (HOSPITAL_COMMUNITY)
Admission: RE | Admit: 2023-12-18 | Discharge: 2023-12-18 | Disposition: A | Discharge status: Home / Self Care | Attending: Gastroenterology | Admitting: Gastroenterology

## 2023-12-18 ENCOUNTER — Ambulatory Visit (HOSPITAL_COMMUNITY): Admitting: Certified Registered"

## 2023-12-18 ENCOUNTER — Encounter (HOSPITAL_COMMUNITY)
Admission: RE | Disposition: A | Discharge status: Home / Self Care | Payer: Self-pay | Source: Home / Self Care | Attending: Gastroenterology

## 2023-12-18 ENCOUNTER — Encounter (HOSPITAL_COMMUNITY): Payer: Self-pay | Admitting: Gastroenterology

## 2023-12-18 DIAGNOSIS — K219 Gastro-esophageal reflux disease without esophagitis: Secondary | ICD-10-CM | POA: Insufficient documentation

## 2023-12-18 DIAGNOSIS — G4733 Obstructive sleep apnea (adult) (pediatric): Secondary | ICD-10-CM | POA: Insufficient documentation

## 2023-12-18 DIAGNOSIS — K222 Esophageal obstruction: Secondary | ICD-10-CM | POA: Insufficient documentation

## 2023-12-18 DIAGNOSIS — R1310 Dysphagia, unspecified: Secondary | ICD-10-CM | POA: Diagnosis present

## 2023-12-18 HISTORY — PX: ESOPHAGOGASTRODUODENOSCOPY: SHX5428

## 2023-12-18 HISTORY — PX: SAVORY DILATION: SHX5439

## 2023-12-18 SURGERY — EGD (ESOPHAGOGASTRODUODENOSCOPY)
Anesthesia: Monitor Anesthesia Care

## 2023-12-18 MED ORDER — SODIUM CHLORIDE 0.9 % IV SOLN: INTRAVENOUS | Status: DC | PRN

## 2023-12-18 MED ORDER — SODIUM CHLORIDE 0.9 % IV SOLN
INTRAVENOUS | Status: AC | PRN
  Administered 2023-12-18: 250 mL via INTRAVENOUS

## 2023-12-18 MED ORDER — LIDOCAINE 2% (20 MG/ML) 5 ML SYRINGE
INTRAMUSCULAR | Status: DC | PRN
  Administered 2023-12-18: 40 mg via INTRAVENOUS

## 2023-12-18 MED ORDER — PROPOFOL 500 MG/50ML IV EMUL
INTRAVENOUS | Status: DC | PRN
  Administered 2023-12-18: 200 ug/kg/min via INTRAVENOUS

## 2023-12-18 NOTE — Anesthesia Preprocedure Evaluation (Addendum)
 Anesthesia Evaluation  Patient identified by MRN, date of birth, ID band Patient awake    Reviewed: Allergy & Precautions, NPO status , Patient's Chart, lab work & pertinent test results  History of Anesthesia Complications Negative for: history of anesthetic complications  Airway Mallampati: I  TM Distance: >3 FB Neck ROM: Full    Dental  (+) Dental Advisory Given, Teeth Intact   Pulmonary sleep apnea and Continuous Positive Airway Pressure Ventilation    breath sounds clear to auscultation       Cardiovascular negative cardio ROS  Rhythm:Regular Rate:Normal     Neuro/Psych negative neurological ROS     GI/Hepatic Neg liver ROS,GERD  Medicated and Controlled,,  Endo/Other  BMI 38  Renal/GU negative Renal ROS     Musculoskeletal   Abdominal   Peds  Hematology negative hematology ROS (+)   Anesthesia Other Findings Breast cancer  Reproductive/Obstetrics                             Anesthesia Physical Anesthesia Plan  ASA: 3  Anesthesia Plan: MAC   Post-op Pain Management: Minimal or no pain anticipated   Induction:   PONV Risk Score and Plan: 2 and Treatment may vary due to age or medical condition  Airway Management Planned: Natural Airway and Simple Face Mask  Additional Equipment: None  Intra-op Plan:   Post-operative Plan:   Informed Consent: I have reviewed the patients History and Physical, chart, labs and discussed the procedure including the risks, benefits and alternatives for the proposed anesthesia with the patient or authorized representative who has indicated his/her understanding and acceptance.     Dental advisory given  Plan Discussed with: CRNA and Surgeon  Anesthesia Plan Comments:         Anesthesia Quick Evaluation

## 2023-12-18 NOTE — Op Note (Signed)
 [facility] Patient Name: Dorothy Gallagher Procedure Date: 12/18/2023 MRN: [id number] Attending MD: Alvis Jourdain , MD, [id number] Date of Birth: 1971-04-26 CSN: [id number] Age: 53 Admit Type: Ambulatory Procedure:                Upper GI endoscopy Indications:              Dysphagia Providers:                Alvis Jourdain, MD, Suzann Ernst, RN, Gabino Joe, Technician Referring MD:              Medicines:                Propofol  per Anesthesia Complications:            No immediate complications. Estimated Blood Loss:     Estimated blood loss was minimal. Procedure:                Pre-Anesthesia Assessment:                           - Prior to the procedure, a History and Physical                            was performed, and patient medications and                            allergies were reviewed. The patient's tolerance of                            previous anesthesia was also reviewed. The risks                            and benefits of the procedure and the sedation                            options and risks were discussed with the patient.                            All questions were answered, and informed consent                            was obtained. Prior Anticoagulants: The patient has                            taken no anticoagulant or antiplatelet agents. ASA                            Grade Assessment: II - A patient with mild systemic                            disease. After reviewing the risks and benefits,  the patient was deemed in satisfactory condition to                            undergo the procedure.                           - Sedation was administered by an anesthesia                            professional. Deep sedation was attained.                           After obtaining informed consent, the endoscope was                            passed under direct vision. Throughout the                             procedure, the patient's blood pressure, pulse, and                            oxygen saturations were monitored continuously. The                            GIF-H190 ([id number]) Olympus endoscope was introduced                            through the mouth, and advanced to the second part                            of duodenum. The upper GI endoscopy was                            accomplished without difficulty. The patient                            tolerated the procedure well. Scope In: Scope Out: Findings:      One benign-appearing, intrinsic mild stenosis was found at the       gastroesophageal junction. This stenosis measured 1.7 cm (inner       diameter) x less than one cm (in length). The stenosis was traversed. A       guidewire was placed and the scope was withdrawn. Dilation was performed       with a Savary dilator with no resistance at 18 mm. The dilation site was       examined following endoscope reinsertion and showed mild mucosal       disruption.      The stomach was normal.      The examined duodenum was normal. Impression:               - Benign-appearing esophageal stenosis. Dilated.                           - Normal stomach.                           -  Normal examined duodenum.                           - No specimens collected. Moderate Sedation:      Not Applicable - Patient had care per Anesthesia. Recommendation:           - Patient has a contact number available for                            emergencies. The signs and symptoms of potential                            delayed complications were discussed with the                            patient. Return to normal activities tomorrow.                            Written discharge instructions were provided to the                            patient.                           - Resume regular diet.                           - Continue present medications.                           -  Return to GI clinic in 4 weeks. Procedure Code(s):        --- Professional ---                           [phone], Esophagogastroduodenoscopy, flexible,                            transoral; with insertion of guide wire followed by                            passage of dilator(s) through esophagus over guide                            wire Diagnosis Code(s):        --- Professional ---                           K22.2, Esophageal obstruction                           R13.10, Dysphagia, unspecified CPT copyright 2022 [employer]. All rights reserved. The codes documented in this report are preliminary and upon coder review may  be revised to meet current compliance requirements. Alvis Jourdain, MD Alvis Jourdain, MD 12/18/2023 12:05:07 PM This report has been signed electronically. Number of Addenda: 0

## 2023-12-18 NOTE — Discharge Instructions (Signed)

## 2023-12-18 NOTE — Transfer of Care (Signed)
 Immediate Anesthesia Transfer of Care Note  Patient: Dorothy Gallagher  Procedure(s) Performed: EGD (ESOPHAGOGASTRODUODENOSCOPY) EGD, WITH DILATION USING SAVARY-GILLIARD DILATOR OVER GUIDEWIRE  Patient Location: PACU  Anesthesia Type:MAC  Level of Consciousness: drowsy  Airway & Oxygen Therapy: Patient Spontanous Breathing and Patient connected to face mask oxygen  Post-op Assessment: Report given to RN and Post -op Vital signs reviewed and stable  Post vital signs: Reviewed and stable  Last Vitals:  Vitals Value Taken Time  BP 118/58 12/18/23 1156  Temp 36.6 C 12/18/23 1156  Pulse 64 12/18/23 1158  Resp 21 12/18/23 1158  SpO2 100 % 12/18/23 1158  Vitals shown include unfiled device data.  Last Pain:  Vitals:   12/18/23 1156  TempSrc: Temporal  PainSc: Asleep      Patients Stated Pain Goal: 0 (12/18/23 1113)  Complications: No notable events documented.

## 2023-12-18 NOTE — Anesthesia Postprocedure Evaluation (Signed)
 Anesthesia Post Note  Patient: Dorothy Gallagher  Procedure(s) Performed: EGD (ESOPHAGOGASTRODUODENOSCOPY) EGD, WITH DILATION USING SAVARY-GILLIARD DILATOR OVER GUIDEWIRE     Patient location during evaluation: Endoscopy Anesthesia Type: MAC Level of consciousness: awake and alert, patient cooperative and oriented Pain management: pain level controlled Vital Signs Assessment: post-procedure vital signs reviewed and stable Respiratory status: nonlabored ventilation, spontaneous breathing and respiratory function stable Cardiovascular status: blood pressure returned to baseline and stable Postop Assessment: no apparent nausea or vomiting and adequate PO intake Anesthetic complications: no   No notable events documented.  Last Vitals:  Vitals:   12/18/23 1156 12/18/23 1200  BP: (!) 118/58 112/61  Pulse: 67 65  Resp: (!) 7 (!) 24  Temp: 36.6 C   SpO2: 100% 100%    Last Pain:  Vitals:   12/18/23 1200  TempSrc:   PainSc: 0-No pain                 Mella Inclan,E. Vlada Uriostegui

## 2023-12-18 NOTE — H&P (Signed)
 [facility] HPI: Dorothy Gallagher is a 53 year old female who reports experiencing dysphagia, particularly when eating solid foods. She describes episodes where she feels her throat closes, and sometimes water helps alleviate the sensation, though not always. This issue has been more noticeable over the past year or two, with a particularly severe episode occurring recently. She notes that the problem often occurs with the first bite of food. She also has problems with swallowing water at times.  Aran also reports frequent heartburn and she intermittently uses over-the-counter acid medications, such as store-brand versions of Zantac. She occasionally wakes up at night with acid symptoms and sometimes takes medication after dinner if she feels discomfort. She has tried Prilosec in the past but not on a daily basis.  Additionally, Tiayana mentions having obstructive sleep apnea, with 87 episodes per hour. She uses a CPAP machine with increased air pressure and a humidifier, but still feels like there is something trapped in her throat, which she describes as granules. She is unsure of the exact location of the sensation but believes it might be in the back of her throat.   Past Medical History:  Diagnosis Date   Breast cancer (HCC)    Breast cancer of upper-outer quadrant of right female breast (HCC) 10/04/2015   GERD (gastroesophageal reflux disease)    H/O colonoscopy 2016   Hearing loss    right ear   Personal history of radiation therapy    Snores     Past Surgical History:  Procedure Laterality Date   BREAST BIOPSY Right 10/04/2015   malignant   BREAST LUMPECTOMY Right 10/31/2015   CHOLECYSTECTOMY     ESSURE TUBAL LIGATION     RADIOACTIVE SEED GUIDED PARTIAL MASTECTOMY WITH AXILLARY SENTINEL LYMPH NODE BIOPSY Right 10/31/2015   Procedure: RADIOACTIVE SEED GUIDED RIGHT PARTIAL MASTECTOMY WITH RIGHT AXILLARY SENTINEL LYMPH NODE BIOPSY;  Surgeon: Enid Harry, MD;  Location: [facility];  Service: General;  Laterality: Right;   WISDOM TOOTH EXTRACTION      Family History  Problem Relation Age of Onset   Breast cancer Mother 94       w/ metastasis   Other Mother        hx of partial hysterectomy; hx of ovarian cysts   Lung cancer Father 4       former smoker   Colon cancer Maternal Grandmother        dx. 4s   Fibrocystic breast disease Sister    Colon polyps Sister        approx 1-3   Colon cancer Maternal Uncle    Colon polyps Sister        1-3   Fibrocystic breast disease Sister        cysts "enlarge and go down"   Cancer Maternal Aunt        NOS; d. 15s   Cancer Maternal Uncle        blood, liver, or pancreatic cancer; dx. late 60s-early 70s   Lung cancer Maternal Uncle        smoker; d. late 74s   Leukemia Cousin        maternal 1st cousin dx. late 71s   Breast cancer Cousin        s/p lumpectomy; dx. late 59s or earlier   Thyroid cancer Cousin 26       maternal 1st cousin; s/p thyroidectomy   Other Paternal Grandmother        hx of "benign 40-lb stomach  tumor" that was removed   Heart attack Paternal Grandfather 31   Cancer Cousin 21       "knot on leg" that metastasized to bone cancer    Social History:  reports that she has never smoked. She has never used smokeless tobacco. She reports current alcohol use. She reports that she does not use drugs.  Allergies: No Known Allergies  Medications: Scheduled: Continuous:  No results found for this or any previous visit (from the past 24 hours).   No results found.  ROS:  As stated above in the HPI otherwise negative.  Blood pressure (!) 142/86, temperature 97.7 F (36.5 C), temperature source Temporal, resp. rate (!) 22, height 5\' 4"  (1.626 m), weight 99.8 kg, last menstrual period 11/03/2015, SpO2 97%.    PE: Gen: NAD, Alert and Oriented HEENT:  [place]/AT, EOMI Neck: Supple, no LAD Lungs: CTA Bilaterally CV: RRR without M/G/R ABD: Soft, NTND, +BS Ext: No  C/C/E  Assessment/Plan: 1) Dysphagia - EGD with dilation.  Cicley Ganesh D 12/18/2023, 11:17 AM

## 2023-12-20 ENCOUNTER — Encounter (HOSPITAL_COMMUNITY): Payer: Self-pay | Admitting: Gastroenterology

## 2024-06-28 ENCOUNTER — Other Ambulatory Visit: Payer: Self-pay | Admitting: Hematology and Oncology

## 2024-06-28 ENCOUNTER — Telehealth: Payer: Self-pay | Admitting: Hematology and Oncology

## 2024-06-28 NOTE — Telephone Encounter (Signed)
 left vm for pt to call back to schedule yearly f/u

## 2024-08-30 ENCOUNTER — Other Ambulatory Visit: Payer: Self-pay | Admitting: Hematology and Oncology

## 2024-08-30 DIAGNOSIS — Z1231 Encounter for screening mammogram for malignant neoplasm of breast: Secondary | ICD-10-CM

## 2024-09-23 ENCOUNTER — Ambulatory Visit

## 2024-09-29 ENCOUNTER — Inpatient Hospital Stay: Admitting: Hematology and Oncology

## 2024-10-18 ENCOUNTER — Inpatient Hospital Stay: Admitting: Hematology and Oncology
